# Patient Record
Sex: Female | Born: 1975 | Race: Black or African American | Hispanic: No | Marital: Married | Smoking: Never smoker
Health system: Southern US, Community
[De-identification: ages and names within clinical notes are randomized; demographics above are authoritative.]

## PROBLEM LIST (undated history)

## (undated) HISTORY — PX: TONSILLECTOMY: SUR1361

## (undated) HISTORY — PX: ABDOMINAL HYSTERECTOMY: SHX81

---

## 1999-10-25 ENCOUNTER — Encounter: Payer: Self-pay | Admitting: Family Medicine

## 1999-10-25 ENCOUNTER — Ambulatory Visit (HOSPITAL_COMMUNITY): Admission: RE | Admit: 1999-10-25 | Discharge: 1999-10-25 | Payer: Self-pay | Admitting: Family Medicine

## 1999-11-23 ENCOUNTER — Other Ambulatory Visit: Admission: RE | Admit: 1999-11-23 | Discharge: 1999-11-23 | Payer: Self-pay | Admitting: Family Medicine

## 2000-02-09 ENCOUNTER — Encounter: Payer: Self-pay | Admitting: Family Medicine

## 2000-02-09 ENCOUNTER — Ambulatory Visit (HOSPITAL_COMMUNITY): Admission: RE | Admit: 2000-02-09 | Discharge: 2000-02-09 | Payer: Self-pay | Admitting: Family Medicine

## 2000-03-05 ENCOUNTER — Other Ambulatory Visit: Admission: RE | Admit: 2000-03-05 | Discharge: 2000-03-05 | Payer: Self-pay | Admitting: Family Medicine

## 2000-03-05 ENCOUNTER — Other Ambulatory Visit: Admission: RE | Admit: 2000-03-05 | Discharge: 2000-03-05 | Payer: Self-pay | Admitting: Unknown Physician Specialty

## 2000-06-08 ENCOUNTER — Inpatient Hospital Stay (HOSPITAL_COMMUNITY): Admission: AD | Admit: 2000-06-08 | Discharge: 2000-06-11 | Payer: Self-pay | Admitting: Family Medicine

## 2002-05-30 ENCOUNTER — Other Ambulatory Visit: Admission: RE | Admit: 2002-05-30 | Discharge: 2002-05-30 | Payer: Self-pay | Admitting: Family Medicine

## 2004-03-31 ENCOUNTER — Other Ambulatory Visit: Admission: RE | Admit: 2004-03-31 | Discharge: 2004-03-31 | Payer: Self-pay | Admitting: Family Medicine

## 2004-04-26 ENCOUNTER — Ambulatory Visit: Payer: Self-pay | Admitting: Gastroenterology

## 2004-05-02 ENCOUNTER — Ambulatory Visit: Payer: Self-pay | Admitting: Gastroenterology

## 2012-08-18 ENCOUNTER — Encounter (HOSPITAL_BASED_OUTPATIENT_CLINIC_OR_DEPARTMENT_OTHER): Payer: Self-pay | Admitting: *Deleted

## 2012-08-18 ENCOUNTER — Emergency Department (HOSPITAL_BASED_OUTPATIENT_CLINIC_OR_DEPARTMENT_OTHER): Payer: BC Managed Care – HMO

## 2012-08-18 ENCOUNTER — Emergency Department (HOSPITAL_BASED_OUTPATIENT_CLINIC_OR_DEPARTMENT_OTHER)
Admission: EM | Admit: 2012-08-18 | Discharge: 2012-08-19 | Disposition: A | Discharge status: Home / Self Care | Payer: BC Managed Care – HMO | Attending: Emergency Medicine | Admitting: Emergency Medicine

## 2012-08-18 DIAGNOSIS — Z3202 Encounter for pregnancy test, result negative: Secondary | ICD-10-CM | POA: Insufficient documentation

## 2012-08-18 DIAGNOSIS — J45909 Unspecified asthma, uncomplicated: Secondary | ICD-10-CM

## 2012-08-18 DIAGNOSIS — R112 Nausea with vomiting, unspecified: Secondary | ICD-10-CM | POA: Insufficient documentation

## 2012-08-18 DIAGNOSIS — J45901 Unspecified asthma with (acute) exacerbation: Secondary | ICD-10-CM | POA: Insufficient documentation

## 2012-08-18 DIAGNOSIS — R0602 Shortness of breath: Secondary | ICD-10-CM | POA: Insufficient documentation

## 2012-08-18 LAB — URINALYSIS, ROUTINE W REFLEX MICROSCOPIC
Bilirubin Urine: NEGATIVE
Glucose, UA: NEGATIVE mg/dL
Hgb urine dipstick: NEGATIVE
Ketones, ur: NEGATIVE mg/dL
Leukocytes, UA: NEGATIVE
Nitrite: NEGATIVE
Protein, ur: NEGATIVE mg/dL
Specific Gravity, Urine: 1.022 (ref 1.005–1.030)
Urobilinogen, UA: 0.2 mg/dL (ref 0.0–1.0)
pH: 6 (ref 5.0–8.0)

## 2012-08-18 LAB — PREGNANCY, URINE: Preg Test, Ur: NEGATIVE

## 2012-08-18 NOTE — ED Notes (Signed)
Pt states she has had a cough off and on for years. Coughs up black flecks at times. Sometimes feels like something is sitting on her chest. Rash to neck. Food get s stuck. Hair loss. Coughs until she gags at triage. Feels tired. "Just don't feel good"

## 2012-08-18 NOTE — ED Notes (Signed)
Patient states BP cuff has caused her to get rash up her left arm.

## 2012-08-19 MED ORDER — HYDROCOD POLST-CHLORPHEN POLST 10-8 MG/5ML PO LQCR: 5.0000 mL | Freq: Two times a day (BID) | ORAL | Status: DC | PRN

## 2012-08-19 MED ORDER — FLUTICASONE PROPIONATE HFA 44 MCG/ACT IN AERO
2.0000 | INHALATION_SPRAY | Freq: Two times a day (BID) | RESPIRATORY_TRACT | Status: DC
  Administered 2012-08-19: 2 via RESPIRATORY_TRACT
  Filled 2012-08-19: qty 10.6

## 2012-08-19 NOTE — ED Provider Notes (Signed)
History    Scribed for Hanley Seamen, MD, the patient was seen in room MH03/MH03. This chart was scribed by Lewanda Rife, ED scribe. Patient's care was started at 0007   CSN: [id number]  Arrival date & time 08/18/12  2035   First MD Initiated Contact with Patient 08/19/12 0006      Chief Complaint  Patient presents with  . Cough    (Consider location/radiation/quality/duration/timing/severity/associated sxs/prior treatment) HPI HPI Comments: Lauren [name] is a 37 y.o. female who presents to the Emergency Department complaining of waxing and waning moderate cough onset November but worse for 2 days. Reports various associated symptoms including "occassional black flakes in vomit", patchy alopecia, shortness of breath, wheezing, nausea, posttussive emesis. Denies diarrhea, fever, abdominal pain, and chest pain. Reports cough is aggravated with deep breathing. Denies taking any medications at home for cough prior to arrival except albuterol. Reports prescribed prednisone courses given in the past have not relieved shortness of breath or cough. Reports using prescribed albuterol inhaler at home with no relief of symptoms.  Dr. Willaim Bane is PCP in [place]    History reviewed. No pertinent past medical history.  Past Surgical History  Procedure Laterality Date  . Cesarean section    . Tonsillectomy      History reviewed. No pertinent family history.  History  Substance Use Topics  . Smoking status: Never Smoker   . Smokeless tobacco: Not on file  . Alcohol Use: No    OB History   Grav Para Term Preterm Abortions TAB SAB Ect Mult Living                  Review of Systems  Respiratory: Positive for cough and shortness of breath.   Cardiovascular: Negative for chest pain.  All other systems reviewed and are negative.   A complete 10 system review of systems was obtained and all systems are negative except as noted in the HPI and PMH.    Allergies  Sulfa  antibiotics  Home Medications  No current outpatient prescriptions on file.  BP 148/82  Pulse 94  Temp(Src) 98.3 F (36.8 C) (Oral)  Resp 20  Ht 5' (1.524 m)  Wt 180 lb (81.647 kg)  BMI 35.15 kg/m2  SpO2 96%  LMP 08/18/2012  Physical Exam  Nursing note and vitals reviewed. Constitutional: She is oriented to person, place, and time. She appears well-developed and well-nourished. No distress.  HENT:  Head: Normocephalic and atraumatic.  Small patches of sparse hair   Eyes: EOM are normal.  Neck: Neck supple. No tracheal deviation present.  Cardiovascular: Normal rate.   Pulmonary/Chest: Effort normal. No respiratory distress. She exhibits no tenderness.  Decreased air movement bilaterally   Coughs on attempted deep breath   Abdominal: Soft. She exhibits no mass. There is no tenderness.  Musculoskeletal: Normal range of motion.  Neurological: She is alert and oriented to person, place, and time.  Skin: Skin is warm and dry.  Psychiatric: She has a normal mood and affect. Her behavior is normal.    ED Course  Procedures (including critical care time)    MDM   Nursing notes and vitals signs, including pulse oximetry, reviewed.  Summary of this visit's results, reviewed by myself:  Labs:  Results for orders placed during the hospital encounter of 08/18/12 (from the past 24 hour(s))  URINALYSIS, ROUTINE W REFLEX MICROSCOPIC     Status: None   Collection Time    08/18/12  9:34 PM  Result Value Range   Color, Urine YELLOW  YELLOW   APPearance CLEAR  CLEAR   Specific Gravity, Urine 1.022  1.005 - 1.030   pH 6.0  5.0 - 8.0   Glucose, UA NEGATIVE  NEGATIVE mg/dL   Hgb urine dipstick NEGATIVE  NEGATIVE   Bilirubin Urine NEGATIVE  NEGATIVE   Ketones, ur NEGATIVE  NEGATIVE mg/dL   Protein, ur NEGATIVE  NEGATIVE mg/dL   Urobilinogen, UA 0.2  0.0 - 1.0 mg/dL   Nitrite NEGATIVE  NEGATIVE   Leukocytes, UA NEGATIVE  NEGATIVE  PREGNANCY, URINE     Status: None    Collection Time    08/18/12  9:34 PM      Result Value Range   Preg Test, Ur NEGATIVE  NEGATIVE    Imaging Studies: Dg Chest 2 View  08/18/2012  *RADIOLOGY REPORT*  Clinical Data: Cough, shortness of breath.  CHEST - 2 VIEW  Comparison: None.  Findings: Heart is normal size.  Low lung volumes with bibasilar opacities on the frontal view, likely atelectasis.  No confluent opacity noted on the lateral view.  No effusions.  No acute bony abnormality.  IMPRESSION: Low lung volumes, bibasilar atelectasis.   Original Report Authenticated By: Charlett Nose, M.D.           I personally performed the services described in this documentation, which was scribed in my presence.  The recorded information has been reviewed and is accurate.   Hanley Seamen, MD 08/19/12 [phone]

## 2014-04-13 ENCOUNTER — Telehealth: Payer: Self-pay | Admitting: Family Medicine

## 2014-04-13 NOTE — Telephone Encounter (Signed)
Pt needs appt for pap and we are on her Medicaid insurance,pt given appt with [name] 05/13/14 @ 1.

## 2014-05-13 ENCOUNTER — Ambulatory Visit (INDEPENDENT_AMBULATORY_CARE_PROVIDER_SITE_OTHER): Payer: Medicaid Other | Admitting: Family

## 2014-05-13 ENCOUNTER — Encounter: Payer: Self-pay | Admitting: Family

## 2014-05-13 VITALS — BP 122/82 | HR 78 | Temp 98.4°F | Ht 60.0 in | Wt 206.0 lb

## 2014-05-13 DIAGNOSIS — N898 Other specified noninflammatory disorders of vagina: Secondary | ICD-10-CM

## 2014-05-13 DIAGNOSIS — Z01419 Encounter for gynecological examination (general) (routine) without abnormal findings: Secondary | ICD-10-CM

## 2014-05-13 DIAGNOSIS — Z0000 Encounter for general adult medical examination without abnormal findings: Secondary | ICD-10-CM

## 2014-05-13 DIAGNOSIS — Z713 Dietary counseling and surveillance: Secondary | ICD-10-CM

## 2014-05-13 DIAGNOSIS — N9489 Other specified conditions associated with female genital organs and menstrual cycle: Secondary | ICD-10-CM

## 2014-05-13 LAB — POCT WET PREP (WET MOUNT): TRICHOMONAS WET PREP HPF POC: NEGATIVE

## 2014-05-13 MED ORDER — TRIAMCINOLONE ACETONIDE 0.5 % EX OINT: 1.0000 "application " | TOPICAL_OINTMENT | Freq: Two times a day (BID) | CUTANEOUS | Status: DC

## 2014-05-13 MED ORDER — PHENTERMINE HCL 15 MG PO CAPS
15.0000 mg | ORAL_CAPSULE | ORAL | Status: DC
Start: 2014-05-13 — End: 2014-09-09

## 2014-05-13 NOTE — Progress Notes (Signed)
   Subjective:    Patient ID: Lauren [name], female    DOB: [birth year], 39 y.o.   MRN: [id number]  HPI Pt presents to office to establish care and CPE with pap. Pt currently not taking any medications at this time. Pt denies any headache, palpitations, SOB, or edema.   Pt states she is having some vaginal odor at this time and would like to be tested.    Review of Systems  Constitutional: Negative.   HENT: Negative.   Eyes: Negative.   Respiratory: Negative.  Negative for shortness of breath.   Cardiovascular: Negative.  Negative for palpitations.  Gastrointestinal: Negative.   Endocrine: Negative.   Genitourinary: Negative.   Musculoskeletal: Negative.   Neurological: Negative.  Negative for headaches.  Hematological: Negative.   Psychiatric/Behavioral: Negative.   All other systems reviewed and are negative.      Objective:   Physical Exam  Constitutional: She is oriented to person, place, and time. She appears well-developed and well-nourished. No distress.  HENT:  Head: Normocephalic and atraumatic.  Right Ear: External ear normal.  Left Ear: External ear normal.  Nose: Nose normal.  Mouth/Throat: Oropharynx is clear and moist.  Eyes: Pupils are equal, round, and reactive to light.  Neck: Normal range of motion. Neck supple. No thyromegaly present.  Cardiovascular: Normal rate, regular rhythm, normal heart sounds and intact distal pulses.   No murmur heard. Pulmonary/Chest: Effort normal and breath sounds normal. No respiratory distress. She has no wheezes.  Abdominal: Soft. Bowel sounds are normal. She exhibits no distension. There is no tenderness.  Genitourinary:  Bimanual exam- no adnexal masses or tenderness  Musculoskeletal: Normal range of motion. She exhibits no edema or tenderness.  Neurological: She is alert and oriented to person, place, and time. She has normal reflexes. No cranial nerve deficit.  Skin: Skin is warm and dry.  Psychiatric: She has a  normal mood and affect. Her behavior is normal. Judgment and thought content normal.  Vitals reviewed.   BP 122/82 mmHg  Pulse 78  Temp(Src) 98.4 F (36.9 C) (Oral)  Ht 5' (1.524 m)  Wt 206 lb (93.441 kg)  BMI 40.23 kg/m2  LMP 08/18/2012       Assessment & Plan:  1. Annual physical exam - CMP14+EGFR - Lipid panel - Thyroid Panel With TSH - Vit D  25 hydroxy (rtn osteoporosis monitoring) - Pap IG w/ reflex to HPV when ASC-U  2. Encounter for routine gynecological examination - Pap IG w/ reflex to HPV when ASC-U  3. Vaginal odor - POCT Wet Prep [facility])   4. Encounter for weight loss counseling -Diet and exericse -RTO in 8 weeks - DC if no weight loss in 12 weeks (<5%) - phentermine 15 MG capsule; Take 1 capsule (15 mg total) by mouth every morning.  Dispense: 30 capsule; Refill: 1   Continue all meds Labs pending Health Maintenance reviewed Diet and exercise encouraged RTO 1 year   [name], FNP

## 2014-05-13 NOTE — Patient Instructions (Addendum)

## 2014-05-14 LAB — VITAMIN D 25 HYDROXY (VIT D DEFICIENCY, FRACTURES): Vit D, 25-Hydroxy: 28.8 ng/mL — ABNORMAL LOW (ref 30.0–100.0)

## 2014-05-14 LAB — CMP14+EGFR
A/G RATIO: 1.2 (ref 1.1–2.5)
ALBUMIN: 4 g/dL (ref 3.5–5.5)
ALK PHOS: 99 IU/L (ref 39–117)
ALT: 23 IU/L (ref 0–32)
AST: 17 IU/L (ref 0–40)
BUN/Creatinine Ratio: 14 (ref 8–20)
BUN: 9 mg/dL (ref 6–20)
CALCIUM: 9.5 mg/dL (ref 8.7–10.2)
CHLORIDE: 106 mmol/L (ref 97–108)
CO2: 22 mmol/L (ref 18–29)
CREATININE: 0.64 mg/dL (ref 0.57–1.00)
GFR calc Af Amer: 130 mL/min/{1.73_m2} (ref >59)
GFR calc non Af Amer: 113 mL/min/{1.73_m2} (ref >59)
GLOBULIN, TOTAL: 3.3 g/dL (ref 1.5–4.5)
Glucose: 88 mg/dL (ref 65–99)
Potassium: 4.7 mmol/L (ref 3.5–5.2)
Sodium: 145 mmol/L — ABNORMAL HIGH (ref 134–144)
TOTAL PROTEIN: 7.3 g/dL (ref 6.0–8.5)
Total Bilirubin: <0.2 mg/dL (ref 0.0–1.2)

## 2014-05-14 LAB — LIPID PANEL
CHOL/HDL RATIO: 3.9 ratio (ref 0.0–4.4)
Cholesterol, Total: 176 mg/dL (ref 100–199)
HDL: 45 mg/dL (ref >39)
LDL CALC: 116 mg/dL — AB (ref 0–99)
TRIGLYCERIDES: 73 mg/dL (ref 0–149)
VLDL Cholesterol Cal: 15 mg/dL (ref 5–40)

## 2014-05-14 LAB — THYROID PANEL WITH TSH
Free Thyroxine Index: 2.2 (ref 1.2–4.9)
T3 Uptake Ratio: 26 % (ref 24–39)
T4, Total: 8.3 ug/dL (ref 4.5–12.0)
TSH: 1.54 u[IU]/mL (ref 0.450–4.500)

## 2014-05-15 ENCOUNTER — Telehealth: Payer: Self-pay | Admitting: Family

## 2014-05-15 ENCOUNTER — Other Ambulatory Visit: Payer: Self-pay | Admitting: Family

## 2014-05-15 DIAGNOSIS — E559 Vitamin D deficiency, unspecified: Secondary | ICD-10-CM | POA: Insufficient documentation

## 2014-05-15 DIAGNOSIS — E785 Hyperlipidemia, unspecified: Secondary | ICD-10-CM | POA: Insufficient documentation

## 2014-05-15 LAB — PAP IG W/ RFLX HPV ASCU: PAP SMEAR COMMENT: 0

## 2014-05-15 MED ORDER — VITAMIN D (ERGOCALCIFEROL) 1.25 MG (50000 UNIT) PO CAPS: 50000.0000 [IU] | ORAL_CAPSULE | ORAL | Status: DC

## 2014-05-15 MED ORDER — PRAVASTATIN SODIUM 20 MG PO TABS: 20.0000 mg | ORAL_TABLET | Freq: Every day | ORAL | Status: DC

## 2014-05-15 NOTE — Telephone Encounter (Signed)
-----   Message from Sharion Balloon, [place] sent at 05/15/2014  2:39 PM EST ----- Kidney and liver function stable Cholesterol levels elevated- RX sent to phar,ac Thyroid levels WNL Vit D levels low- RX sent to pharmacy Pap negative for lesion or malignancy

## 2014-05-15 NOTE — Telephone Encounter (Signed)
Lab result was sent to the pool

## 2014-05-15 NOTE — Telephone Encounter (Signed)
Need Labs Results and has Questions about a prescription for her at [facility].  Please call her.

## 2014-05-16 NOTE — Telephone Encounter (Signed)
Notified of results Verbalizes understanding

## 2014-07-10 ENCOUNTER — Ambulatory Visit: Payer: Medicaid Other | Admitting: Family

## 2014-09-09 ENCOUNTER — Encounter (INDEPENDENT_AMBULATORY_CARE_PROVIDER_SITE_OTHER): Payer: Self-pay

## 2014-09-09 ENCOUNTER — Ambulatory Visit (INDEPENDENT_AMBULATORY_CARE_PROVIDER_SITE_OTHER): Payer: Medicaid Other | Admitting: Family

## 2014-09-09 ENCOUNTER — Encounter: Payer: Self-pay | Admitting: Family

## 2014-09-09 VITALS — BP 132/96 | HR 83 | Temp 98.3°F | Ht 60.0 in | Wt 202.0 lb

## 2014-09-09 DIAGNOSIS — M5430 Sciatica, unspecified side: Secondary | ICD-10-CM | POA: Diagnosis not present

## 2014-09-09 MED ORDER — KETOROLAC TROMETHAMINE 60 MG/2ML IM SOLN
60.0000 mg | Freq: Once | INTRAMUSCULAR | Status: AC
  Administered 2014-09-09: 60 mg via INTRAMUSCULAR

## 2014-09-09 MED ORDER — MELOXICAM 15 MG PO TABS: 15.0000 mg | ORAL_TABLET | Freq: Every day | ORAL | Status: DC

## 2014-09-09 MED ORDER — PHENTERMINE HCL 37.5 MG PO CAPS: 37.5000 mg | ORAL_CAPSULE | ORAL | Status: DC

## 2014-09-09 MED ORDER — METHYLPREDNISOLONE ACETATE 80 MG/ML IJ SUSP
80.0000 mg | Freq: Once | INTRAMUSCULAR | Status: AC
  Administered 2014-09-09: 80 mg via INTRAMUSCULAR

## 2014-09-09 NOTE — Addendum Note (Signed)
Addended by: [name] A on: 09/09/2014 04:19 PM   Modules accepted: Level of Service

## 2014-09-09 NOTE — Progress Notes (Signed)
   Subjective:    Patient ID: Lauren [name], female    DOB: 1976/02/26, 39 y.o.   MRN: [id number]  Leg Pain  The incident occurred more than 1 week ago. There was no injury mechanism. The pain is present in the left leg, left foot and left thigh. Quality: "feels cold" The pain is at a severity of 5/10. The pain is mild. The pain has been intermittent since onset. Associated symptoms include numbness and tingling. Pertinent negatives include no loss of motion or muscle weakness. Nothing aggravates the symptoms. She has tried rest for the symptoms. The treatment provided mild relief.      Review of Systems  Constitutional: Negative.   HENT: Negative.   Eyes: Negative.   Respiratory: Negative.  Negative for shortness of breath.   Cardiovascular: Negative.  Negative for palpitations.  Gastrointestinal: Negative.   Endocrine: Negative.   Genitourinary: Negative.   Musculoskeletal: Negative.   Neurological: Positive for tingling and numbness. Negative for headaches.  Hematological: Negative.   Psychiatric/Behavioral: Negative.   All other systems reviewed and are negative.      Objective:   Physical Exam  Constitutional: She is oriented to person, place, and time. She appears well-developed and well-nourished. No distress.  HENT:  Head: Normocephalic and atraumatic.  Right Ear: External ear normal.  Left Ear: External ear normal.  Nose: Nose normal.  Mouth/Throat: Oropharynx is clear and moist.  Eyes: Pupils are equal, round, and reactive to light.  Neck: Normal range of motion. Neck supple. No thyromegaly present.  Cardiovascular: Normal rate, regular rhythm, normal heart sounds and intact distal pulses.   No murmur heard. Pulmonary/Chest: Effort normal and breath sounds normal. No respiratory distress. She has no wheezes.  Abdominal: Soft. Bowel sounds are normal. She exhibits no distension. There is no tenderness.  Musculoskeletal: Normal range of motion. She exhibits no edema  or tenderness.  Neurological: She is alert and oriented to person, place, and time. She has normal reflexes. No cranial nerve deficit.  Skin: Skin is warm and dry.  Psychiatric: She has a normal mood and affect. Her behavior is normal. Judgment and thought content normal.  Vitals reviewed.    BP 132/96 mmHg  Pulse 83  Temp(Src) 98.3 F (36.8 C) (Oral)  Ht 5' (1.524 m)  Wt 202 lb (91.627 kg)  BMI 39.45 kg/m2  LMP 08/18/2012      Assessment & Plan:  1. Sciatic leg pain -Rest -Ice -keep elevated as needed -No other NSAID's while taking Mobic -RTO prn - methylPREDNISolone acetate (DEPO-MEDROL) injection 80 mg; Inject 1 mL (80 mg total) into the muscle once. - ketorolac (TORADOL) injection 60 mg; Inject 2 mLs (60 mg total) into the muscle once. - meloxicam (MOBIC) 15 MG tablet; Take 1 tablet (15 mg total) by mouth daily.  Dispense: 30 tablet; Refill: 0  [name], FNP

## 2014-09-09 NOTE — Patient Instructions (Signed)
Sciatica Sciatica is pain, weakness, numbness, or tingling along the path of the sciatic nerve. The nerve starts in the lower back and runs down the back of each leg. The nerve controls the muscles in the lower leg and in the back of the knee, while also providing sensation to the back of the thigh, lower leg, and the sole of your foot. Sciatica is a symptom of another medical condition. For instance, nerve damage or certain conditions, such as a herniated disk or bone spur on the spine, pinch or put pressure on the sciatic nerve. This causes the pain, weakness, or other sensations normally associated with sciatica. Generally, sciatica only affects one side of the body. CAUSES   Herniated or slipped disc.  Degenerative disk disease.  A pain disorder involving the narrow muscle in the buttocks (piriformis syndrome).  Pelvic injury or fracture.  Pregnancy.  Tumor (rare). SYMPTOMS  Symptoms can vary from mild to very severe. The symptoms usually travel from the low back to the buttocks and down the back of the leg. Symptoms can include:  Mild tingling or dull aches in the lower back, leg, or hip.  Numbness in the back of the calf or sole of the foot.  Burning sensations in the lower back, leg, or hip.  Sharp pains in the lower back, leg, or hip.  Leg weakness.  Severe back pain inhibiting movement. These symptoms may get worse with coughing, sneezing, laughing, or prolonged sitting or standing. Also, being overweight may worsen symptoms. DIAGNOSIS  Your caregiver will perform a physical exam to look for common symptoms of sciatica. He or she may ask you to do certain movements or activities that would trigger sciatic nerve pain. Other tests may be performed to find the cause of the sciatica. These may include:  Blood tests.  X-rays.  Imaging tests, such as an MRI or CT scan. TREATMENT  Treatment is directed at the cause of the sciatic pain. Sometimes, treatment is not necessary  and the pain and discomfort goes away on its own. If treatment is needed, your caregiver may suggest:  Over-the-counter medicines to relieve pain.  Prescription medicines, such as anti-inflammatory medicine, muscle relaxants, or narcotics.  Applying heat or ice to the painful area.  Steroid injections to lessen pain, irritation, and inflammation around the nerve.  Reducing activity during periods of pain.  Exercising and stretching to strengthen your abdomen and improve flexibility of your spine. Your caregiver may suggest losing weight if the extra weight makes the back pain worse.  Physical therapy.  Surgery to eliminate what is pressing or pinching the nerve, such as a bone spur or part of a herniated disk. HOME CARE INSTRUCTIONS   Only take over-the-counter or prescription medicines for pain or discomfort as directed by your caregiver.  Apply ice to the affected area for 20 minutes, 3-4 times a day for the first 48-72 hours. Then try heat in the same way.  Exercise, stretch, or perform your usual activities if these do not aggravate your pain.  Attend physical therapy sessions as directed by your caregiver.  Keep all follow-up appointments as directed by your caregiver.  Do not wear high heels or shoes that do not provide proper support.  Check your mattress to see if it is too soft. A firm mattress may lessen your pain and discomfort. SEEK IMMEDIATE MEDICAL CARE IF:   You lose control of your bowel or bladder (incontinence).  You have increasing weakness in the lower back, pelvis, buttocks,  or legs.  You have redness or swelling of your back.  You have a burning sensation when you urinate.  You have pain that gets worse when you lie down or awakens you at night.  Your pain is worse than you have experienced in the past.  Your pain is lasting longer than 4 weeks.  You are suddenly losing weight without reason. MAKE SURE YOU:  Understand these  instructions.  Will watch your condition.  Will get help right away if you are not doing well or get worse. Document Released: 03/21/2001 Document Revised: 09/26/2011 Document Reviewed: 08/06/2011 [facility] Patient Information 2015 [place], [place]. This information is not intended to replace advice given to you by your health care provider. Make sure you discuss any questions you have with your health care provider.

## 2014-10-14 ENCOUNTER — Other Ambulatory Visit: Payer: Self-pay | Admitting: Family

## 2015-04-22 ENCOUNTER — Encounter: Payer: Self-pay | Admitting: Family

## 2015-04-22 ENCOUNTER — Ambulatory Visit (INDEPENDENT_AMBULATORY_CARE_PROVIDER_SITE_OTHER): Payer: Medicaid Other | Admitting: Family

## 2015-04-22 VITALS — BP 135/84 | HR 76 | Temp 97.3°F | Ht 60.0 in | Wt 205.0 lb

## 2015-04-22 DIAGNOSIS — A499 Bacterial infection, unspecified: Secondary | ICD-10-CM | POA: Diagnosis not present

## 2015-04-22 DIAGNOSIS — B9689 Other specified bacterial agents as the cause of diseases classified elsewhere: Secondary | ICD-10-CM

## 2015-04-22 DIAGNOSIS — N898 Other specified noninflammatory disorders of vagina: Secondary | ICD-10-CM

## 2015-04-22 DIAGNOSIS — N760 Acute vaginitis: Secondary | ICD-10-CM | POA: Diagnosis not present

## 2015-04-22 DIAGNOSIS — N9489 Other specified conditions associated with female genital organs and menstrual cycle: Secondary | ICD-10-CM

## 2015-04-22 LAB — POCT WET PREP (WET MOUNT)

## 2015-04-22 MED ORDER — METRONIDAZOLE 500 MG PO TABS: 500.0000 mg | ORAL_TABLET | Freq: Two times a day (BID) | ORAL | Status: DC

## 2015-04-22 NOTE — Progress Notes (Signed)
   Subjective:    Patient ID: Lauren [name], female    DOB: [birth year], 40 y.o.   MRN: [id number]  Vaginal Discharge The patient's primary symptoms include genital itching, a genital odor and vaginal discharge. The patient's pertinent negatives include no genital lesions or genital rash. This is a new problem. The current episode started more than 1 month ago. The problem occurs intermittently. The problem has been waxing and waning. The pain is mild. Associated symptoms include abdominal pain (right lower) and painful intercourse. Pertinent negatives include no back pain, chills, constipation, diarrhea, discolored urine, dysuria, frequency, hematuria, sore throat or urgency. The vaginal discharge was clear. There has been no bleeding. She has tried antibiotics for the symptoms. She is sexually active.      Review of Systems  Constitutional: Negative.  Negative for chills.  HENT: Negative.  Negative for sore throat.   Eyes: Negative.   Respiratory: Negative.  Negative for shortness of breath.   Cardiovascular: Negative.  Negative for palpitations.  Gastrointestinal: Positive for abdominal pain (right lower). Negative for diarrhea and constipation.  Endocrine: Negative.   Genitourinary: Positive for vaginal discharge. Negative for dysuria, urgency, frequency and hematuria.  Musculoskeletal: Negative.  Negative for back pain.  Neurological: Negative.   Hematological: Negative.   Psychiatric/Behavioral: Negative.   All other systems reviewed and are negative.      Objective:   Physical Exam  Constitutional: She is oriented to person, place, and time. She appears well-developed and well-nourished. No distress.  HENT:  Head: Normocephalic and atraumatic.  Right Ear: External ear normal.  Mouth/Throat: Oropharynx is clear and moist.  Eyes: Pupils are equal, round, and reactive to light.  Neck: Normal range of motion. Neck supple. No thyromegaly present.  Cardiovascular: Normal rate,  regular rhythm, normal heart sounds and intact distal pulses.   No murmur heard. Pulmonary/Chest: Effort normal and breath sounds normal. No respiratory distress. She has no wheezes.  Abdominal: Soft. Bowel sounds are normal. She exhibits no distension. There is no tenderness.  Musculoskeletal: Normal range of motion. She exhibits no edema or tenderness.  Neurological: She is alert and oriented to person, place, and time. She has normal reflexes. No cranial nerve deficit.  Skin: Skin is warm and dry.  Psychiatric: She has a normal mood and affect. Her behavior is normal. Judgment and thought content normal.  Vitals reviewed.     BP 177/98 mmHg  Pulse 99  Temp(Src) 97.3 F (36.3 C) (Oral)  Ht 5' (1.524 m)  Wt 205 lb (92.987 kg)  BMI 40.04 kg/m2  LMP 08/18/2012     Assessment & Plan:  1. Vaginal odor - POCT Wet Prep [facility])  2. BV (bacterial vaginosis) -Keep clean and dry -Avoid tight clothing -Cotton underwear -RTo prn - metroNIDAZOLE (FLAGYL) 500 MG tablet; Take 1 tablet (500 mg total) by mouth 2 (two) times daily.  Dispense: 14 tablet; Refill: 0  [name], FNP

## 2015-04-22 NOTE — Patient Instructions (Signed)
Bacterial Vaginosis Bacterial vaginosis is a vaginal infection that occurs when the normal balance of bacteria in the vagina is disrupted. It results from an overgrowth of certain bacteria. This is the most common vaginal infection in women of childbearing age. Treatment is important to prevent complications, especially in pregnant women, as it can cause a premature delivery. CAUSES  Bacterial vaginosis is caused by an increase in harmful bacteria that are normally present in smaller amounts in the vagina. Several different kinds of bacteria can cause bacterial vaginosis. However, the reason that the condition develops is not fully understood. RISK FACTORS Certain activities or behaviors can put you at an increased risk of developing bacterial vaginosis, including:  Having a new sex partner or multiple sex partners.  Douching.  Using an intrauterine device (IUD) for contraception. Women do not get bacterial vaginosis from toilet seats, bedding, swimming pools, or contact with objects around them. SIGNS AND SYMPTOMS  Some women with bacterial vaginosis have no signs or symptoms. Common symptoms include:  Grey vaginal discharge.  A fishlike odor with discharge, especially after sexual intercourse.  Itching or burning of the vagina and vulva.  Burning or pain with urination. DIAGNOSIS  Your health care provider will take a medical history and examine the vagina for signs of bacterial vaginosis. A sample of vaginal fluid may be taken. Your health care provider will look at this sample under a microscope to check for bacteria and abnormal cells. A vaginal pH test may also be done.  TREATMENT  Bacterial vaginosis may be treated with antibiotic medicines. These may be given in the form of a pill or a vaginal cream. A second round of antibiotics may be prescribed if the condition comes back after treatment. Because bacterial vaginosis increases your risk for sexually transmitted diseases, getting  treated can help reduce your risk for chlamydia, gonorrhea, HIV, and herpes. HOME CARE INSTRUCTIONS   Only take over-the-counter or prescription medicines as directed by your health care provider.  If antibiotic medicine was prescribed, take it as directed. Make sure you finish it even if you start to feel better.  Tell all sexual partners that you have a vaginal infection. They should see their health care provider and be treated if they have problems, such as a mild rash or itching.  During treatment, it is important that you follow these instructions:  Avoid sexual activity or use condoms correctly.  Do not douche.  Avoid alcohol as directed by your health care provider.  Avoid breastfeeding as directed by your health care provider. SEEK MEDICAL CARE IF:   Your symptoms are not improving after 3 days of treatment.  You have increased discharge or pain.  You have a fever. MAKE SURE YOU:   Understand these instructions.  Will watch your condition.  Will get help right away if you are not doing well or get worse. FOR MORE INFORMATION  Centers for Disease Control and Prevention, Division of STD Prevention: [employer]raiser[employer]raud[employer] American Sexual Health Association (ASHA): www.ashastd.org    This information is not intended to replace advice given to you by your health care provider. Make sure you discuss any questions you have with your health care provider.   Document Released: 03/27/2005 Document Revised: 04/17/2014 Document Reviewed: 11/06/2012 Elsevier Interactive Patient Education [employer].

## 2015-12-01 ENCOUNTER — Encounter: Payer: Self-pay | Admitting: Pediatrics

## 2015-12-01 ENCOUNTER — Ambulatory Visit (INDEPENDENT_AMBULATORY_CARE_PROVIDER_SITE_OTHER): Payer: Self-pay | Admitting: Pediatrics

## 2015-12-01 VITALS — BP 122/83 | HR 88 | Temp 97.3°F | Ht 60.0 in | Wt 204.0 lb

## 2015-12-01 DIAGNOSIS — Z021 Encounter for pre-employment examination: Secondary | ICD-10-CM

## 2015-12-01 DIAGNOSIS — Z111 Encounter for screening for respiratory tuberculosis: Secondary | ICD-10-CM

## 2015-12-01 DIAGNOSIS — E669 Obesity, unspecified: Secondary | ICD-10-CM

## 2015-12-01 DIAGNOSIS — Z23 Encounter for immunization: Secondary | ICD-10-CM

## 2015-12-01 NOTE — Addendum Note (Signed)
Addended by: Rolena Infante on: 12/01/2015 03:41 PM   Modules accepted: Orders

## 2015-12-01 NOTE — Progress Notes (Signed)
  Subjective:   Patient ID: Lauren [name], female    DOB: May 05, 1975, 40 y.o.   MRN: [id number] CC: Physical for work  HPI: Lauren [name] is a 40 y.o. 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 Allergies and medications reviewed and updated. History  Smoking Status  . Never Smoker  Smokeless Tobacco  . Not on file   ROS: Per HPI   Objective:    BP 122/83   Pulse 88   Temp 97.3 F (36.3 C) (Oral)   Ht 5' (1.524 m)   Wt 204 lb (92.5 kg)   LMP 08/18/2012   BMI 39.84 kg/m   Wt Readings from Last 3 Encounters:  12/01/15 204 lb (92.5 kg)  04/22/15 205 lb (93 kg)  09/09/14 202 lb (91.6 kg)    Gen: NAD, alert, cooperative with exam, NCAT EYES: EOMI, no conjunctival injection, or no icterus CV: NRRR, normal S1/S2, no murmur, distal pulses 2+ b/l Resp: CTABL, no wheezes, normal WOB Abd: +BS, soft, NTND. no guarding or organomegaly Ext: No edema, warm Neuro: Alert and oriented, strength equal b/l UE and LE, coordination grossly normal MSK: normal muscle bulk  Assessment & Plan:  [name] was seen today for physical for work.  Diagnoses and all orders for this visit:  Encounter for pre-employment examination Needs ppd and Tdap  Obesity, Class II, BMI 35-39.9 Decrease juice to none before next visit  Swelling Trial compression hose Always better in the morning  Pap smear: last 05/2014  Follow up plan: 2 mo for CPE Assunta Found, MD [place]

## 2015-12-03 ENCOUNTER — Ambulatory Visit: Payer: Self-pay | Admitting: *Deleted

## 2015-12-03 DIAGNOSIS — Z111 Encounter for screening for respiratory tuberculosis: Secondary | ICD-10-CM

## 2015-12-03 LAB — TB SKIN TEST
Induration: 0 mm
TB SKIN TEST: NEGATIVE

## 2015-12-03 NOTE — Progress Notes (Signed)
Pt here to have PPD read and PPD is negative.

## 2017-09-25 ENCOUNTER — Ambulatory Visit: Payer: BC Managed Care – PPO | Admitting: Family

## 2017-09-25 ENCOUNTER — Encounter: Payer: Self-pay | Admitting: Family

## 2017-09-25 ENCOUNTER — Ambulatory Visit (INDEPENDENT_AMBULATORY_CARE_PROVIDER_SITE_OTHER): Payer: BC Managed Care – PPO

## 2017-09-25 VITALS — BP 139/88 | HR 85 | Temp 98.2°F | Ht 60.0 in | Wt 202.8 lb

## 2017-09-25 DIAGNOSIS — R0602 Shortness of breath: Secondary | ICD-10-CM

## 2017-09-25 DIAGNOSIS — J4521 Mild intermittent asthma with (acute) exacerbation: Secondary | ICD-10-CM

## 2017-09-25 DIAGNOSIS — J45909 Unspecified asthma, uncomplicated: Secondary | ICD-10-CM | POA: Insufficient documentation

## 2017-09-25 DIAGNOSIS — J069 Acute upper respiratory infection, unspecified: Secondary | ICD-10-CM | POA: Diagnosis not present

## 2017-09-25 MED ORDER — MONTELUKAST SODIUM 10 MG PO TABS: 10.0000 mg | ORAL_TABLET | Freq: Every day | ORAL | 1 refills | Status: DC

## 2017-09-25 MED ORDER — PREDNISONE 10 MG (21) PO TBPK: ORAL_TABLET | ORAL | 0 refills | Status: DC

## 2017-09-25 MED ORDER — BUDESONIDE-FORMOTEROL FUMARATE 80-4.5 MCG/ACT IN AERO: 2.0000 | INHALATION_SPRAY | Freq: Two times a day (BID) | RESPIRATORY_TRACT | 3 refills | Status: DC

## 2017-09-25 NOTE — Patient Instructions (Signed)

## 2017-09-25 NOTE — Progress Notes (Signed)
   Subjective:    Patient ID: Lauren [name], female    DOB: 01-Sep-1975, 42 y.o.   MRN: [id number]  Chief Complaint  Patient presents with  . recheck from Urgent Care visit    Asthma  She complains of cough, difficulty breathing, frequent throat clearing, hoarse voice, shortness of breath, sputum production and wheezing. The problem occurs intermittently. The cough is productive of sputum. Associated symptoms include malaise/fatigue and orthopnea. Pertinent negatives include no dyspnea on exertion or sore throat. Her symptoms are alleviated by OTC inhaler and oral steroids (zpak). Her symptoms are not alleviated by rest, OTC cough suppressant and oral steroids. Her past medical history is significant for asthma.      Review of Systems  Constitutional: Positive for malaise/fatigue.  HENT: Positive for hoarse voice. Negative for sore throat.   Respiratory: Positive for cough, sputum production, shortness of breath and wheezing.   Cardiovascular: Negative for dyspnea on exertion.  All other systems reviewed and are negative.      Objective:   Physical Exam  Constitutional: She is oriented to person, place, and time. She appears well-developed and well-nourished. No distress.  HENT:  Head: Normocephalic and atraumatic.  Right Ear: External ear normal.  Left Ear: External ear normal.  Mouth/Throat: Oropharynx is clear and moist.  Eyes: Pupils are equal, round, and reactive to light.  Neck: Normal range of motion. Neck supple. No thyromegaly present.  Cardiovascular: Normal rate, regular rhythm, normal heart sounds and intact distal pulses.  No murmur heard. Pulmonary/Chest: Effort normal and breath sounds normal. No respiratory distress. She has no wheezes.  Constant nonproductive cough  Abdominal: Soft. Bowel sounds are normal. She exhibits no distension. There is no tenderness.  Musculoskeletal: Normal range of motion. She exhibits no edema or tenderness.  Neurological: She is  alert and oriented to person, place, and time. She has normal reflexes. No cranial nerve deficit.  Skin: Skin is warm and dry.  Psychiatric: She has a normal mood and affect. Her behavior is normal. Judgment and thought content normal.  Vitals reviewed.     BP 139/88   Pulse 85   Temp 98.2 F (36.8 C) (Oral)   Ht 5' (1.524 m)   Wt 202 lb 12.8 oz (92 kg)   LMP 08/18/2012   BMI 39.61 kg/m      Assessment & Plan:  Lauren [name] comes in today with chief complaint of recheck from Urgent Care visit   Diagnosis and orders addressed:  1. Mild intermittent asthma with acute exacerbation Avoid allergens Start Singulair and prednisone Sample of Symbicort given - montelukast (SINGULAIR) 10 MG tablet; Take 1 tablet (10 mg total) by mouth at bedtime.  Dispense: 90 tablet; Refill: 1 - predniSONE (STERAPRED UNI-PAK 21 TAB) 10 MG (21) TBPK tablet; Use as directed  Dispense: 21 tablet; Refill: 0 - DG Chest 2 View; Future - budesonide-formoterol (SYMBICORT) 80-4.5 MCG/ACT inhaler; Inhale 2 puffs into the lungs 2 (two) times daily.  Dispense: 1 Inhaler; Refill: 3  2. Viral upper respiratory tract infection - predniSONE (STERAPRED UNI-PAK 21 TAB) 10 MG (21) TBPK tablet; Use as directed  Dispense: 21 tablet; Refill: 0  3. SOB (shortness of breath) - DG Chest 2 View; Future   Lauren Dun, FNP

## 2017-10-01 ENCOUNTER — Encounter: Payer: Self-pay | Admitting: Family

## 2017-10-01 ENCOUNTER — Ambulatory Visit (INDEPENDENT_AMBULATORY_CARE_PROVIDER_SITE_OTHER): Payer: BC Managed Care – PPO | Admitting: Family

## 2017-10-01 VITALS — BP 128/86 | HR 84 | Temp 97.5°F | Ht 60.0 in | Wt 206.8 lb

## 2017-10-01 DIAGNOSIS — Z01419 Encounter for gynecological examination (general) (routine) without abnormal findings: Secondary | ICD-10-CM | POA: Diagnosis not present

## 2017-10-01 DIAGNOSIS — Z0000 Encounter for general adult medical examination without abnormal findings: Secondary | ICD-10-CM

## 2017-10-01 DIAGNOSIS — E559 Vitamin D deficiency, unspecified: Secondary | ICD-10-CM

## 2017-10-01 DIAGNOSIS — N898 Other specified noninflammatory disorders of vagina: Secondary | ICD-10-CM

## 2017-10-01 DIAGNOSIS — E785 Hyperlipidemia, unspecified: Secondary | ICD-10-CM

## 2017-10-01 DIAGNOSIS — J4521 Mild intermittent asthma with (acute) exacerbation: Secondary | ICD-10-CM

## 2017-10-01 LAB — WET PREP FOR TRICH, YEAST, CLUE
Clue Cell Exam: NEGATIVE
Trichomonas Exam: NEGATIVE
YEAST EXAM: NEGATIVE

## 2017-10-01 MED ORDER — METRONIDAZOLE 500 MG PO TABS: 500.0000 mg | ORAL_TABLET | Freq: Two times a day (BID) | ORAL | 0 refills | Status: DC

## 2017-10-01 NOTE — Progress Notes (Signed)
Subjective:    Patient ID: Lauren [name], female    DOB: 13-Dec-1975, 42 y.o.   MRN: [id number]  No chief complaint on file.  PT presents to the office today for CPE with pap.  Asthma  She complains of cough and wheezing. This is a chronic problem. The current episode started more than 1 year ago. The problem occurs intermittently. The problem has been waxing and waning. Pertinent negatives include no ear pain, myalgias or sore throat. Her symptoms are not alleviated by rest and steroid inhaler. Her past medical history is significant for asthma.  Hyperlipidemia  This is a chronic problem. The current episode started more than 1 year ago. The problem is uncontrolled. Recent lipid tests were reviewed and are high. Exacerbating diseases include obesity. Pertinent negatives include no myalgias. Current antihyperlipidemic treatment includes diet change. The current treatment provides mild improvement of lipids. Risk factors for coronary artery disease include dyslipidemia, obesity, hypertension and a sedentary lifestyle.  Gynecologic Exam  The patient's primary symptoms include a genital odor and vaginal discharge. The current episode started 1 to 4 weeks ago. The problem occurs intermittently. Pertinent negatives include no sore throat.      Review of Systems  HENT: Negative for ear pain and sore throat.   Respiratory: Positive for cough and wheezing.   Genitourinary: Positive for vaginal discharge.  Musculoskeletal: Negative for myalgias.  All other systems reviewed and are negative.      Objective:   Physical Exam  Constitutional: She is oriented to person, place, and time. She appears well-developed and well-nourished. No distress.  HENT:  Head: Normocephalic and atraumatic.  Right Ear: External ear normal.  Left Ear: External ear normal.  Mouth/Throat: Oropharynx is clear and moist.  Eyes: Pupils are equal, round, and reactive to light.  Neck: Normal range of motion. Neck  supple. No thyromegaly present.  Cardiovascular: Normal rate, regular rhythm, normal heart sounds and intact distal pulses.  No murmur heard. Pulmonary/Chest: Effort normal and breath sounds normal. No respiratory distress. She has no wheezes. Right breast exhibits no inverted nipple, no mass, no nipple discharge, no skin change and no tenderness. Left breast exhibits no inverted nipple, no mass, no nipple discharge, no skin change and no tenderness.  Abdominal: Soft. Bowel sounds are normal. She exhibits no distension. There is no tenderness.  Genitourinary: Vagina normal.  Genitourinary Comments: Bimanual exam- no adnexal masses or tenderness, ovaries nonpalpable    No discharge   Musculoskeletal: Normal range of motion. She exhibits no edema or tenderness.  Neurological: She is alert and oriented to person, place, and time. She has normal reflexes. No cranial nerve deficit.  Skin: Skin is warm and dry.  Psychiatric: She has a normal mood and affect. Her behavior is normal. Judgment and thought content normal.  Vitals reviewed.     BP 128/86   Pulse 84   Temp (!) 97.5 F (36.4 C)   Ht 5' (1.524 m)   Wt 206 lb 12.8 oz (93.8 kg)   LMP 08/18/2012   BMI 40.39 kg/m      Assessment & Plan:  Lauren [name] comes in today with chief complaint of No chief complaint on file.   Diagnosis and orders addressed:  1. Mild intermittent asthma with acute exacerbation - CMP14+EGFR - CBC with Differential/Platelet  2. Vitamin D deficiency - CMP14+EGFR - CBC with Differential/Platelet - VITAMIN D 25 Hydroxy (Vit-D Deficiency, Fractures)  3. Hyperlipidemia, unspecified hyperlipidemia type - CMP14+EGFR - CBC with Differential/Platelet -  Lipid panel  4. Annual physical exam - CMP14+EGFR - CBC with Differential/Platelet - Lipid panel - TSH - VITAMIN D 25 Hydroxy (Vit-D Deficiency, Fractures) - IGP, Aptima HPV, rfx 16/18,45  5. Gynecologic exam normal - CMP14+EGFR - CBC with  Differential/Platelet - IGP, Aptima HPV, rfx 16/18,45  6. Vaginal discharge - WET PREP FOR [place], YEAST, [place] pending Health Maintenance reviewed Diet and exercise encouraged  Follow up plan: 1 year    [name], FNP

## 2017-10-01 NOTE — Patient Instructions (Addendum)
Health Maintenance, Female Adopting a healthy lifestyle and getting preventive care can go a long way to promote health and wellness. Talk with your health care provider about what schedule of regular examinations is right for you. This is a good chance for you to check in with your provider about disease prevention and staying healthy. In between checkups, there are plenty of things you can do on your own. Experts have done a lot of research about which lifestyle changes and preventive measures are most likely to keep you healthy. Ask your health care provider for more information. Weight and diet Eat a healthy diet  Be sure to include plenty of vegetables, fruits, low-fat dairy products, and lean protein.  Do not eat a lot of foods high in solid fats, added sugars, or salt.  Get regular exercise. This is one of the most important things you can do for your health. ? Most adults should exercise for at least 150 minutes each week. The exercise should increase your heart rate and make you sweat (moderate-intensity exercise). ? Most adults should also do strengthening exercises at least twice a week. This is in addition to the moderate-intensity exercise.  Maintain a healthy weight  Body mass index (BMI) is a measurement that can be used to identify possible weight problems. It estimates body fat based on height and weight. Your health care provider can help determine your BMI and help you achieve or maintain a healthy weight.  For females 20 years of age and older: ? A BMI below 18.5 is considered underweight. ? A BMI of 18.5 to 24.9 is normal. ? A BMI of 25 to 29.9 is considered overweight. ? A BMI of 30 and above is considered obese.  Watch levels of cholesterol and blood lipids  You should start having your blood tested for lipids and cholesterol at 43 years of age, then have this test every 5 years.  You may need to have your cholesterol levels checked more often if: ? Your lipid or  cholesterol levels are high. ? You are older than 43 years of age. ? You are at high risk for heart disease.  Cancer screening Lung Cancer  Lung cancer screening is recommended for adults 55-80 years old who are at high risk for lung cancer because of a history of smoking.  A yearly low-dose CT scan of the lungs is recommended for people who: ? Currently smoke. ? Have quit within the past 15 years. ? Have at least a 30-pack-year history of smoking. A pack year is smoking an average of one pack of cigarettes a day for 1 year.  Yearly screening should continue until it has been 15 years since you quit.  Yearly screening should stop if you develop a health problem that would prevent you from having lung cancer treatment.  Breast Cancer  Practice breast self-awareness. This means understanding how your breasts normally appear and feel.  It also means doing regular breast self-exams. Let your health care provider know about any changes, no matter how small.  If you are in your 20s or 30s, you should have a clinical breast exam (CBE) by a health care provider every 1-3 years as part of a regular health exam.  If you are 40 or older, have a CBE every year. Also consider having a breast X-ray (mammogram) every year.  If you have a family history of breast cancer, talk to your health care provider about genetic screening.  If you are at high risk   for breast cancer, talk to your health care provider about having an MRI and a mammogram every year.  Breast cancer gene (BRCA) assessment is recommended for women who have family members with BRCA-related cancers. BRCA-related cancers include: ? Breast. ? Ovarian. ? Tubal. ? Peritoneal cancers.  Results of the assessment will determine the need for genetic counseling and BRCA1 and BRCA2 testing.  Cervical Cancer Your health care provider may recommend that you be screened regularly for cancer of the pelvic organs (ovaries, uterus, and  vagina). This screening involves a pelvic examination, including checking for microscopic changes to the surface of your cervix (Pap test). You may be encouraged to have this screening done every 3 years, beginning at age 22.  For women ages 56-65, health care providers may recommend pelvic exams and Pap testing every 3 years, or they may recommend the Pap and pelvic exam, combined with testing for human papilloma virus (HPV), every 5 years. Some types of HPV increase your risk of cervical cancer. Testing for HPV may also be done on women of any age with unclear Pap test results.  Other health care providers may not recommend any screening for nonpregnant women who are considered low risk for pelvic cancer and who do not have symptoms. Ask your health care provider if a screening pelvic exam is right for you.  If you have had past treatment for cervical cancer or a condition that could lead to cancer, you need Pap tests and screening for cancer for at least 20 years after your treatment. If Pap tests have been discontinued, your risk factors (such as having a new sexual partner) need to be reassessed to determine if screening should resume. Some women have medical problems that increase the chance of getting cervical cancer. In these cases, your health care provider may recommend more frequent screening and Pap tests.  Colorectal Cancer  This type of cancer can be detected and often prevented.  Routine colorectal cancer screening usually begins at 43 years of age and continues through 43 years of age.  Your health care provider may recommend screening at an earlier age if you have risk factors for colon cancer.  Your health care provider may also recommend using home test kits to check for hidden blood in the stool.  A small camera at the end of a tube can be used to examine your colon directly (sigmoidoscopy or colonoscopy). This is done to check for the earliest forms of colorectal  cancer.  Routine screening usually begins at age 33.  Direct examination of the colon should be repeated every 5-10 years through 43 years of age. However, you may need to be screened more often if early forms of precancerous polyps or small growths are found.  Skin Cancer  Check your skin from head to toe regularly.  Tell your health care provider about any new moles or changes in moles, especially if there is a change in a mole's shape or color.  Also tell your health care provider if you have a mole that is larger than the size of a pencil eraser.  Always use sunscreen. Apply sunscreen liberally and repeatedly throughout the day.  Protect yourself by wearing long sleeves, pants, a wide-brimmed hat, and sunglasses whenever you are outside.  Heart disease, diabetes, and high blood pressure  High blood pressure causes heart disease and increases the risk of stroke. High blood pressure is more likely to develop in: ? People who have blood pressure in the high end of  the normal range (130-139/85-89 mm Hg). ? People who are overweight or obese. ? People who are African American.  If you are 21-29 years of age, have your blood pressure checked every 3-5 years. If you are 3 years of age or older, have your blood pressure checked every year. You should have your blood pressure measured twice-once when you are at a hospital or clinic, and once when you are not at a hospital or clinic. Record the average of the two measurements. To check your blood pressure when you are not at a hospital or clinic, you can use: ? An automated blood pressure machine at a pharmacy. ? A home blood pressure monitor.  If you are between 17 years and 37 years old, ask your health care provider if you should take aspirin to prevent strokes.  Have regular diabetes screenings. This involves taking a blood sample to check your fasting blood sugar level. ? If you are at a normal weight and have a low risk for diabetes,  have this test once every three years after 43 years of age. ? If you are overweight and have a high risk for diabetes, consider being tested at a younger age or more often. Preventing infection Hepatitis B  If you have a higher risk for hepatitis B, you should be screened for this virus. You are considered at high risk for hepatitis B if: ? You were born in a country where hepatitis B is common. Ask your health care provider which countries are considered high risk. ? Your parents were born in a high-risk country, and you have not been immunized against hepatitis B (hepatitis B vaccine). ? You have HIV or AIDS. ? You use needles to inject street drugs. ? You live with someone who has hepatitis B. ? You have had sex with someone who has hepatitis B. ? You get hemodialysis treatment. ? You take certain medicines for conditions, including cancer, organ transplantation, and autoimmune conditions.  Hepatitis C  Blood testing is recommended for: ? Everyone born from [birth year] through 1966. ? Anyone with known risk factors for hepatitis C.  Sexually transmitted infections (STIs)  You should be screened for sexually transmitted infections (STIs) including gonorrhea and chlamydia if: ? You are sexually active and are younger than 43 years of age. ? You are older than 43 years of age and your health care provider tells you that you are at risk for this type of infection. ? Your sexual activity has changed since you were last screened and you are at an increased risk for chlamydia or gonorrhea. Ask your health care provider if you are at risk.  If you do not have HIV, but are at risk, it may be recommended that you take a prescription medicine daily to prevent HIV infection. This is called pre-exposure prophylaxis (PrEP). You are considered at risk if: ? You are sexually active and do not regularly use condoms or know the HIV status of your partner(s). ? You take drugs by injection. ? You are  sexually active with a partner who has HIV.  Talk with your health care provider about whether you are at high risk of being infected with HIV. If you choose to begin PrEP, you should first be tested for HIV. You should then be tested every 3 months for as long as you are taking PrEP. Pregnancy  If you are premenopausal and you may become pregnant, ask your health care provider about preconception counseling.  If you may become  pregnant, take 400 to 800 micrograms (mcg) of folic acid every day.  If you want to prevent pregnancy, talk to your health care provider about birth control (contraception). Osteoporosis and menopause  Osteoporosis is a disease in which the bones lose minerals and strength with aging. This can result in serious bone fractures. Your risk for osteoporosis can be identified using a bone density scan.  If you are 65 years of age or older, or if you are at risk for osteoporosis and fractures, ask your health care provider if you should be screened.  Ask your health care provider whether you should take a calcium or vitamin D supplement to lower your risk for osteoporosis.  Menopause may have certain physical symptoms and risks.  Hormone replacement therapy may reduce some of these symptoms and risks. Talk to your health care provider about whether hormone replacement therapy is right for you. Follow these instructions at home:  Schedule regular health, dental, and eye exams.  Stay current with your immunizations.  Do not use any tobacco products including cigarettes, chewing tobacco, or electronic cigarettes.  If you are pregnant, do not drink alcohol.  If you are breastfeeding, limit how much and how often you drink alcohol.  Limit alcohol intake to no more than 1 drink per day for nonpregnant women. One drink equals 12 ounces of beer, 5 ounces of wine, or 1 ounces of hard liquor.  Do not use street drugs.  Do not share needles.  Ask your health care  provider for help if you need support or information about quitting drugs.  Tell your health care provider if you often feel depressed.  Tell your health care provider if you have ever been abused or do not feel safe at home. This information is not intended to replace advice given to you by your health care provider. Make sure you discuss any questions you have with your health care provider. Document Released: 10/10/2010 Document Revised: 09/02/2015 Document Reviewed: 12/29/2014 Elsevier Interactive Patient Education  2018 Elsevier Inc.  Bacterial Vaginosis Bacterial vaginosis is a vaginal infection that occurs when the normal balance of bacteria in the vagina is disrupted. It results from an overgrowth of certain bacteria. This is the most common vaginal infection among women ages 15-44. Because bacterial vaginosis increases your risk for STIs (sexually transmitted infections), getting treated can help reduce your risk for chlamydia, gonorrhea, herpes, and HIV (human immunodeficiency virus). Treatment is also important for preventing complications in pregnant women, because this condition can cause an early (premature) delivery. What are the causes? This condition is caused by an increase in harmful bacteria that are normally present in small amounts in the vagina. However, the reason that the condition develops is not fully understood. What increases the risk? The following factors may make you more likely to develop this condition:  Having a new sexual partner or multiple sexual partners.  Having unprotected sex.  Douching.  Having an intrauterine device (IUD).  Smoking.  Drug and alcohol abuse.  Taking certain antibiotic medicines.  Being pregnant.  You cannot get bacterial vaginosis from toilet seats, bedding, swimming pools, or contact with objects around you. What are the signs or symptoms? Symptoms of this condition include:  Grey or white vaginal discharge. The  discharge can also be watery or foamy.  A fish-like odor with discharge, especially after sexual intercourse or during menstruation.  Itching in and around the vagina.  Burning or pain with urination.  Some women with bacterial vaginosis have no   signs or symptoms. How is this diagnosed? This condition is diagnosed based on:  Your medical history.  A physical exam of the vagina.  Testing a sample of vaginal fluid under a microscope to look for a large amount of bad bacteria or abnormal cells. Your health care provider may use a cotton swab or a small wooden spatula to collect the sample.  How is this treated? This condition is treated with antibiotics. These may be given as a pill, a vaginal cream, or a medicine that is put into the vagina (suppository). If the condition comes back after treatment, a second round of antibiotics may be needed. Follow these instructions at home: Medicines  Take over-the-counter and prescription medicines only as told by your health care provider.  Take or use your antibiotic as told by your health care provider. Do not stop taking or using the antibiotic even if you start to feel better. General instructions  If you have a female sexual partner, tell her that you have a vaginal infection. She should see her health care provider and be treated if she has symptoms. If you have a female sexual partner, he does not need treatment.  During treatment: ? Avoid sexual activity until you finish treatment. ? Do not douche. ? Avoid alcohol as directed by your health care provider. ? Avoid breastfeeding as directed by your health care provider.  Drink enough water and fluids to keep your urine clear or pale yellow.  Keep the area around your vagina and rectum clean. ? Wash the area daily with warm water. ? Wipe yourself from front to back after using the toilet.  Keep all follow-up visits as told by your health care provider. This is important. How is this  prevented?  Do not douche.  Wash the outside of your vagina with warm water only.  Use protection when having sex. This includes latex condoms and dental dams.  Limit how many sexual partners you have. To help prevent bacterial vaginosis, it is best to have sex with just one partner (monogamous).  Make sure you and your sexual partner are tested for STIs.  Wear cotton or cotton-lined underwear.  Avoid wearing tight pants and pantyhose, especially during summer.  Limit the amount of alcohol that you drink.  Do not use any products that contain nicotine or tobacco, such as cigarettes and e-cigarettes. If you need help quitting, ask your health care provider.  Do not use illegal drugs. Where to find more information:  Centers for Disease Control and Prevention: AppraiserFraud.fi  American Sexual Health Association (ASHA): www.ashastd.org  [place] Department of Health and Financial controller, Office on Women's Health: DustingSprays.pl or SecuritiesCard.it Contact a health care provider if:  Your symptoms do not improve, even after treatment.  You have more discharge or pain when urinating.  You have a fever.  You have pain in your abdomen.  You have pain during sex.  You have vaginal bleeding between periods. Summary  Bacterial vaginosis is a vaginal infection that occurs when the normal balance of bacteria in the vagina is disrupted.  Because bacterial vaginosis increases your risk for STIs (sexually transmitted infections), getting treated can help reduce your risk for chlamydia, gonorrhea, herpes, and HIV (human immunodeficiency virus). Treatment is also important for preventing complications in pregnant women, because the condition can cause an early (premature) delivery.  This condition is treated with antibiotic medicines. These may be given as a pill, a vaginal cream, or a medicine that is put into the vagina (  suppository). This  information is not intended to replace advice given to you by your health care provider. Make sure you discuss any questions you have with your health care provider. Document Released: 03/27/2005 Document Revised: 07/31/2016 Document Reviewed: 12/11/2015 Elsevier Interactive Patient Education  2018 Elsevier Inc.  

## 2017-10-02 ENCOUNTER — Telehealth: Payer: Self-pay | Admitting: Family

## 2017-10-02 LAB — CMP14+EGFR
ALBUMIN: 3.7 g/dL (ref 3.5–5.5)
ALT: 20 IU/L (ref 0–32)
AST: 13 IU/L (ref 0–40)
Albumin/Globulin Ratio: 1.3 (ref 1.2–2.2)
Alkaline Phosphatase: 82 IU/L (ref 39–117)
BUN/Creatinine Ratio: 19 (ref 9–23)
BUN: 11 mg/dL (ref 6–24)
Bilirubin Total: <0.2 mg/dL (ref 0.0–1.2)
CALCIUM: 8.9 mg/dL (ref 8.7–10.2)
CO2: 23 mmol/L (ref 20–29)
CREATININE: 0.58 mg/dL (ref 0.57–1.00)
Chloride: 104 mmol/L (ref 96–106)
GFR calc Af Amer: 131 mL/min/{1.73_m2} (ref >59)
GFR, EST NON AFRICAN AMERICAN: 114 mL/min/{1.73_m2} (ref >59)
Globulin, Total: 2.9 g/dL (ref 1.5–4.5)
Glucose: 102 mg/dL — ABNORMAL HIGH (ref 65–99)
Potassium: 4 mmol/L (ref 3.5–5.2)
Sodium: 140 mmol/L (ref 134–144)
Total Protein: 6.6 g/dL (ref 6.0–8.5)

## 2017-10-02 LAB — CBC WITH DIFFERENTIAL/PLATELET
BASOS: 0 %
Basophils Absolute: 0 10*3/uL (ref 0.0–0.2)
EOS (ABSOLUTE): 0.3 10*3/uL (ref 0.0–0.4)
EOS: 3 %
HEMATOCRIT: 40.7 % (ref 34.0–46.6)
HEMOGLOBIN: 13.7 g/dL (ref 11.1–15.9)
IMMATURE GRANS (ABS): 0 10*3/uL (ref 0.0–0.1)
IMMATURE GRANULOCYTES: 0 %
Lymphocytes Absolute: 4 10*3/uL — ABNORMAL HIGH (ref 0.7–3.1)
Lymphs: 33 %
MCH: 29.5 pg (ref 26.6–33.0)
MCHC: 33.7 g/dL (ref 31.5–35.7)
MCV: 88 fL (ref 79–97)
MONOCYTES: 7 %
Monocytes Absolute: 0.8 10*3/uL (ref 0.1–0.9)
NEUTROS PCT: 57 %
Neutrophils Absolute: 6.9 10*3/uL (ref 1.4–7.0)
Platelets: 323 10*3/uL (ref 150–450)
RBC: 4.65 x10E6/uL (ref 3.77–5.28)
RDW: 13.2 % (ref 12.3–15.4)
WBC: 12.1 10*3/uL — ABNORMAL HIGH (ref 3.4–10.8)

## 2017-10-02 LAB — LIPID PANEL
CHOL/HDL RATIO: 3.9 ratio (ref 0.0–4.4)
Cholesterol, Total: 151 mg/dL (ref 100–199)
HDL: 39 mg/dL — AB (ref >39)
LDL CALC: 98 mg/dL (ref 0–99)
Triglycerides: 70 mg/dL (ref 0–149)
VLDL CHOLESTEROL CAL: 14 mg/dL (ref 5–40)

## 2017-10-02 LAB — TSH: TSH: 1.27 u[IU]/mL (ref 0.450–4.500)

## 2017-10-02 LAB — VITAMIN D 25 HYDROXY (VIT D DEFICIENCY, FRACTURES): Vit D, 25-Hydroxy: 25 ng/mL — ABNORMAL LOW (ref 30.0–100.0)

## 2017-10-02 MED ORDER — DICLOFENAC SODIUM 75 MG PO TBEC: 75.0000 mg | DELAYED_RELEASE_TABLET | Freq: Two times a day (BID) | ORAL | 3 refills | Status: DC

## 2017-10-02 NOTE — Telephone Encounter (Signed)
PT was seen yesterday in office, and she was told that some pain medication was suppose to have been called in but [facility] told her that only on RX was sent, pt is wondering if she needs to continue taking the indoprofen

## 2017-10-02 NOTE — Telephone Encounter (Signed)
Patient aware, script is ready.

## 2017-10-02 NOTE — Telephone Encounter (Signed)
Diclofenac Prescription sent to pharmacy. No other NSAID's and take with food.

## 2017-10-04 ENCOUNTER — Other Ambulatory Visit: Payer: Self-pay | Admitting: Family

## 2017-10-04 LAB — IGP, APTIMA HPV, RFX 16/18,45
HPV Aptima: NEGATIVE
PAP SMEAR COMMENT: 0

## 2017-10-04 MED ORDER — VITAMIN D (ERGOCALCIFEROL) 1.25 MG (50000 UNIT) PO CAPS: 50000.0000 [IU] | ORAL_CAPSULE | ORAL | 3 refills | Status: DC

## 2017-11-13 ENCOUNTER — Encounter: Payer: Self-pay | Admitting: Family

## 2017-11-15 ENCOUNTER — Other Ambulatory Visit: Payer: Self-pay | Admitting: Family

## 2017-11-15 MED ORDER — PHENTERMINE HCL 37.5 MG PO TABS: 37.5000 mg | ORAL_TABLET | Freq: Every day | ORAL | 2 refills | Status: DC

## 2018-02-26 ENCOUNTER — Ambulatory Visit: Payer: BC Managed Care – PPO | Admitting: Family

## 2018-02-26 ENCOUNTER — Encounter: Payer: Self-pay | Admitting: Family

## 2018-02-26 VITALS — BP 125/82 | HR 90 | Temp 97.3°F | Ht 60.0 in | Wt 193.0 lb

## 2018-02-26 DIAGNOSIS — Z713 Dietary counseling and surveillance: Secondary | ICD-10-CM

## 2018-02-26 DIAGNOSIS — E669 Obesity, unspecified: Secondary | ICD-10-CM

## 2018-02-26 MED ORDER — PHENTERMINE HCL 37.5 MG PO TABS: 37.5000 mg | ORAL_TABLET | Freq: Every day | ORAL | 2 refills | Status: DC

## 2018-02-26 NOTE — Patient Instructions (Signed)
Exercising to Lose Weight Exercising can help you to lose weight. In order to lose weight through exercise, you need to do vigorous-intensity exercise. You can tell that you are exercising with vigorous intensity if you are breathing very hard and fast and cannot hold a conversation while exercising. Moderate-intensity exercise helps to maintain your current weight. You can tell that you are exercising at a moderate level if you have a higher heart rate and faster breathing, but you are still able to hold a conversation. How often should I exercise? Choose an activity that you enjoy and set realistic goals. Your health care provider can help you to make an activity plan that works for you. Exercise regularly as directed by your health care provider. This may include:  Doing resistance training twice each week, such as: ? Push-ups. ? Sit-ups. ? Lifting weights. ? Using resistance bands.  Doing a given intensity of exercise for a given amount of time. Choose from these options: ? 150 minutes of moderate-intensity exercise every week. ? 75 minutes of vigorous-intensity exercise every week. ? A mix of moderate-intensity and vigorous-intensity exercise every week.  Children, pregnant women, people who are out of shape, people who are overweight, and older adults may need to consult a health care provider for individual recommendations. If you have any sort of medical condition, be sure to consult your health care provider before starting a new exercise program. What are some activities that can help me to lose weight?  Walking at a rate of at least 4.5 miles an hour.  Jogging or running at a rate of 5 miles per hour.  Biking at a rate of at least 10 miles per hour.  Lap swimming.  Roller-skating or in-line skating.  Cross-country skiing.  Vigorous competitive sports, such as football, basketball, and soccer.  Jumping rope.  Aerobic dancing. How can I be more active in my day-to-day  activities?  Use the stairs instead of the elevator.  Take a walk during your lunch break.  If you drive, park your car farther away from work or school.  If you take public transportation, get off one stop early and walk the rest of the way.  Make all of your phone calls while standing up and walking around.  Get up, stretch, and walk around every 30 minutes throughout the day. What guidelines should I follow while exercising?  Do not exercise so much that you hurt yourself, feel dizzy, or get very short of breath.  Consult your health care provider prior to starting a new exercise program.  Wear comfortable clothes and shoes with good support.  Drink plenty of water while you exercise to prevent dehydration or heat stroke. Body water is lost during exercise and must be replaced.  Work out until you breathe faster and your heart beats faster. This information is not intended to replace advice given to you by your health care provider. Make sure you discuss any questions you have with your health care provider. Document Released: 04/29/2010 Document Revised: 09/02/2015 Document Reviewed: 08/28/2013 Elsevier Interactive Patient Education  2018 Elsevier Inc.  

## 2018-02-26 NOTE — Progress Notes (Signed)
   Subjective:    Patient ID: Lauren [name], female    DOB: Mar 24, 1976, 42 y.o.   MRN: [id number]  Chief Complaint  Patient presents with  . weight management recheck    HPI PT presents to the office for follow up on weight loss after starting phentermine. She has lost 14 lbs since starting phentermine. She states she has tried to cut her carbs. She is states she was walking, but has got a second job and has not been exercising.  She would like to continue the phentermine at this time.   Review of Systems  All other systems reviewed and are negative.      Objective:   Physical Exam  Constitutional: She is oriented to person, place, and time. She appears well-developed and well-nourished. No distress.  HENT:  Head: Normocephalic and atraumatic.  Right Ear: External ear normal.  Left Ear: External ear normal.  Mouth/Throat: Oropharynx is clear and moist.  Eyes: Pupils are equal, round, and reactive to light.  Neck: Normal range of motion. Neck supple. No thyromegaly present.  Cardiovascular: Normal rate, regular rhythm, normal heart sounds and intact distal pulses.  No murmur heard. Pulmonary/Chest: Effort normal and breath sounds normal. No respiratory distress. She has no wheezes.  Abdominal: Soft. Bowel sounds are normal. She exhibits no distension. There is no tenderness.  Musculoskeletal: Normal range of motion. She exhibits no edema or tenderness.  Neurological: She is alert and oriented to person, place, and time. She has normal reflexes. No cranial nerve deficit.  Skin: Skin is warm and dry.  Psychiatric: She has a normal mood and affect. Her behavior is normal. Judgment and thought content normal.  Vitals reviewed.    BP 125/82   Pulse 90   Temp (!) 97.3 F (36.3 C) (Oral)   Ht 5' (1.524 m)   Wt 193 lb (87.5 kg)   LMP 08/18/2012   BMI 37.69 kg/m      Assessment & Plan:  Lauren [name] comes in today with chief complaint of weight management  recheck   Diagnosis and orders addressed:  1. Weight loss counseling, encounter for -We will continue phentermine Encouraged healthy diet and exercising on hour a day Must continue to lose weight, and needs to lose 5% of weight loss to continue RTO in 3 months  - CMP14+EGFR - phentermine (ADIPEX-P) 37.5 MG tablet; Take 1 tablet (37.5 mg total) by mouth daily before breakfast.  Dispense: 30 tablet; Refill: 2  2. Obesity (BMI 30-39.9) - CMP14+EGFR - phentermine (ADIPEX-P) 37.5 MG tablet; Take 1 tablet (37.5 mg total) by mouth daily before breakfast.  Dispense: 30 tablet; Refill: 2   Labs pending Health Maintenance reviewed Diet and exercise encouraged  Follow up plan: 3 months  [name], FNP

## 2018-02-27 LAB — CMP14+EGFR
ALK PHOS: 97 IU/L (ref 39–117)
ALT: 26 IU/L (ref 0–32)
AST: 19 IU/L (ref 0–40)
Albumin/Globulin Ratio: 1.4 (ref 1.2–2.2)
Albumin: 4 g/dL (ref 3.5–5.5)
BILIRUBIN TOTAL: 0.3 mg/dL (ref 0.0–1.2)
BUN/Creatinine Ratio: 19 (ref 9–23)
BUN: 11 mg/dL (ref 6–24)
CALCIUM: 9.2 mg/dL (ref 8.7–10.2)
CHLORIDE: 99 mmol/L (ref 96–106)
CO2: 19 mmol/L — ABNORMAL LOW (ref 20–29)
Creatinine, Ser: 0.58 mg/dL (ref 0.57–1.00)
GFR calc Af Amer: 131 mL/min/{1.73_m2} (ref >59)
GFR calc non Af Amer: 114 mL/min/{1.73_m2} (ref >59)
GLOBULIN, TOTAL: 2.8 g/dL (ref 1.5–4.5)
Glucose: 73 mg/dL (ref 65–99)
POTASSIUM: 4.3 mmol/L (ref 3.5–5.2)
Sodium: 134 mmol/L (ref 134–144)
TOTAL PROTEIN: 6.8 g/dL (ref 6.0–8.5)

## 2018-04-04 ENCOUNTER — Telehealth: Payer: Self-pay

## 2018-04-04 NOTE — Telephone Encounter (Signed)
Insurance denied prior auth for Phentermine   Only 3 months during whole year covered

## 2018-04-27 ENCOUNTER — Other Ambulatory Visit: Payer: Self-pay | Admitting: Family

## 2018-04-27 DIAGNOSIS — J4521 Mild intermittent asthma with (acute) exacerbation: Secondary | ICD-10-CM

## 2018-07-24 ENCOUNTER — Other Ambulatory Visit: Payer: Self-pay | Admitting: Family

## 2018-07-24 DIAGNOSIS — J4521 Mild intermittent asthma with (acute) exacerbation: Secondary | ICD-10-CM

## 2018-07-24 NOTE — Telephone Encounter (Signed)
OV 10/01/17 rtc 1 yr

## 2018-11-07 ENCOUNTER — Other Ambulatory Visit: Payer: Self-pay | Admitting: *Deleted

## 2018-11-07 ENCOUNTER — Other Ambulatory Visit: Payer: Self-pay | Admitting: Family

## 2018-11-07 DIAGNOSIS — J4521 Mild intermittent asthma with (acute) exacerbation: Secondary | ICD-10-CM

## 2018-11-07 MED ORDER — MONTELUKAST SODIUM 10 MG PO TABS: ORAL_TABLET | ORAL | 0 refills | Status: DC

## 2018-11-07 NOTE — Telephone Encounter (Signed)
[facility] NTBS yrly OV was to be in June

## 2018-11-11 ENCOUNTER — Other Ambulatory Visit: Payer: Self-pay | Admitting: Family

## 2019-01-31 DIAGNOSIS — J4521 Mild intermittent asthma with (acute) exacerbation: Secondary | ICD-10-CM

## 2019-01-31 MED ORDER — VITAMIN D (ERGOCALCIFEROL) 1.25 MG (50000 UNIT) PO CAPS: ORAL_CAPSULE | ORAL | 0 refills | Status: DC

## 2019-01-31 MED ORDER — MONTELUKAST SODIUM 10 MG PO TABS: ORAL_TABLET | ORAL | 0 refills | Status: DC

## 2019-02-08 ENCOUNTER — Other Ambulatory Visit: Payer: Self-pay | Admitting: Family

## 2019-02-08 DIAGNOSIS — J4521 Mild intermittent asthma with (acute) exacerbation: Secondary | ICD-10-CM

## 2019-03-05 ENCOUNTER — Other Ambulatory Visit: Payer: Self-pay

## 2019-03-05 DIAGNOSIS — Z20822 Contact with and (suspected) exposure to covid-19: Secondary | ICD-10-CM

## 2019-03-07 LAB — NOVEL CORONAVIRUS, NAA: SARS-CoV-2, NAA: NOT DETECTED

## 2019-05-01 ENCOUNTER — Other Ambulatory Visit: Payer: Self-pay | Admitting: Physician Assistant

## 2019-05-01 NOTE — Telephone Encounter (Signed)
Left detailed message- call back to schedule apt.

## 2019-05-01 NOTE — Telephone Encounter (Signed)
ntbs Last seen over a year ago

## 2019-10-09 ENCOUNTER — Encounter: Payer: BC Managed Care – PPO | Admitting: Family

## 2019-10-14 ENCOUNTER — Ambulatory Visit (INDEPENDENT_AMBULATORY_CARE_PROVIDER_SITE_OTHER): Payer: BC Managed Care – PPO | Admitting: Family

## 2019-10-14 ENCOUNTER — Other Ambulatory Visit: Payer: Self-pay

## 2019-10-14 ENCOUNTER — Other Ambulatory Visit (HOSPITAL_COMMUNITY)
Admission: RE | Admit: 2019-10-14 | Discharge: 2019-10-14 | Disposition: A | Discharge status: Home / Self Care | Payer: BC Managed Care – PPO | Source: Ambulatory Visit | Attending: Family | Admitting: Family

## 2019-10-14 ENCOUNTER — Encounter: Payer: Self-pay | Admitting: Family

## 2019-10-14 VITALS — BP 130/89 | HR 87 | Temp 98.1°F | Ht 61.0 in | Wt 208.4 lb

## 2019-10-14 DIAGNOSIS — J4521 Mild intermittent asthma with (acute) exacerbation: Secondary | ICD-10-CM

## 2019-10-14 DIAGNOSIS — R609 Edema, unspecified: Secondary | ICD-10-CM

## 2019-10-14 DIAGNOSIS — Z0001 Encounter for general adult medical examination with abnormal findings: Secondary | ICD-10-CM | POA: Diagnosis not present

## 2019-10-14 DIAGNOSIS — Z01419 Encounter for gynecological examination (general) (routine) without abnormal findings: Secondary | ICD-10-CM | POA: Diagnosis present

## 2019-10-14 DIAGNOSIS — Z0000 Encounter for general adult medical examination without abnormal findings: Secondary | ICD-10-CM

## 2019-10-14 DIAGNOSIS — Z1159 Encounter for screening for other viral diseases: Secondary | ICD-10-CM

## 2019-10-14 DIAGNOSIS — E559 Vitamin D deficiency, unspecified: Secondary | ICD-10-CM

## 2019-10-14 DIAGNOSIS — E785 Hyperlipidemia, unspecified: Secondary | ICD-10-CM | POA: Diagnosis not present

## 2019-10-14 DIAGNOSIS — L709 Acne, unspecified: Secondary | ICD-10-CM

## 2019-10-14 DIAGNOSIS — E669 Obesity, unspecified: Secondary | ICD-10-CM

## 2019-10-14 DIAGNOSIS — B002 Herpesviral gingivostomatitis and pharyngotonsillitis: Secondary | ICD-10-CM | POA: Insufficient documentation

## 2019-10-14 DIAGNOSIS — B009 Herpesviral infection, unspecified: Secondary | ICD-10-CM

## 2019-10-14 MED ORDER — ALBUTEROL SULFATE HFA 108 (90 BASE) MCG/ACT IN AERS: 2.0000 | INHALATION_SPRAY | Freq: Four times a day (QID) | RESPIRATORY_TRACT | 3 refills | Status: DC | PRN

## 2019-10-14 MED ORDER — CLINDAMYCIN PHOS-BENZOYL PEROX 1-5 % EX GEL: Freq: Two times a day (BID) | CUTANEOUS | 3 refills | Status: DC

## 2019-10-14 MED ORDER — VALACYCLOVIR HCL 1 G PO TABS: 2000.0000 mg | ORAL_TABLET | Freq: Two times a day (BID) | ORAL | 5 refills | Status: DC

## 2019-10-14 MED ORDER — MONTELUKAST SODIUM 10 MG PO TABS: 10.0000 mg | ORAL_TABLET | Freq: Every day | ORAL | 4 refills | Status: DC

## 2019-10-14 NOTE — Progress Notes (Signed)
Subjective:    Patient ID: Lauren [name], female    DOB: 04/26/1975, 44 y.o.   MRN: [id number]  Chief Complaint  Patient presents with   Annual Exam    Patient wants pap   Oral Swelling    painful    Foot Swelling   Pt presents to the office today for CPE with pap. She reports during the hot weather and drinking lots of water she has noticed increased swelling in bilateral ankles.  She is complaining of cold sores on top lip. She reports 3 in the last month.  Asthma There is no cough or wheezing. This is a chronic problem. The current episode started more than 1 year ago. The problem occurs intermittently. Her symptoms are alleviated by rest and beta-agonist. She reports minimal improvement on treatment. Her past medical history is significant for asthma.  Hyperlipidemia This is a chronic problem. The current episode started more than 1 year ago. The problem is uncontrolled. Recent lipid tests were reviewed and are high. Exacerbating diseases include obesity. Current antihyperlipidemic treatment includes diet change. The current treatment provides mild improvement of lipids. Compliance problems include adherence to exercise.   Gynecologic Exam The patient's pertinent negatives include no genital itching, genital lesions, genital rash or missed menses. This is a chronic problem.      Review of Systems  Respiratory: Negative for cough and wheezing.   Genitourinary: Negative for missed menses.  All other systems reviewed and are negative.  Family History  Problem Relation Age of Onset   Heart disease Father    Diabetes Father    Social History   Socioeconomic History   Marital status: Married    Spouse name: Not on file   Number of children: Not on file   Years of education: Not on file   Highest education level: Not on file  Occupational History   Not on file  Tobacco Use   Smoking status: Never Smoker   Smokeless tobacco: Never Used  Substance and Sexual  Activity   Alcohol use: No   Drug use: No   Sexual activity: Yes    Birth control/protection: Surgical  Other Topics Concern   Not on file  Social History Narrative   Not on file   Social Determinants of Health   Financial Resource Strain:    Difficulty of Paying Living Expenses:   Food Insecurity:    Worried About Charity fundraiser in the Last Year:    Arboriculturist in the Last Year:   Transportation Needs:    Film/video editor (Medical):    Lack of Transportation (Non-Medical):   Physical Activity:    Days of Exercise per Week:    Minutes of Exercise per Session:   Stress:    Feeling of Stress :   Social Connections:    Frequency of Communication with Friends and Family:    Frequency of Social Gatherings with Friends and Family:    Attends Religious Services:    Active Member of Clubs or Organizations:    Attends Archivist Meetings:    Marital Status:        Objective:   Physical Exam Vitals reviewed.  Constitutional:      General: She is not in acute distress.    Appearance: She is well-developed. She is obese.  HENT:     Head: Normocephalic and atraumatic.     Right Ear: Tympanic membrane normal.     Left Ear: Tympanic membrane  normal.  Eyes:     Pupils: Pupils are equal, round, and reactive to light.  Neck:     Thyroid: No thyromegaly.  Cardiovascular:     Rate and Rhythm: Normal rate and regular rhythm.     Heart sounds: Normal heart sounds. No murmur heard.   Pulmonary:     Effort: Pulmonary effort is normal. No respiratory distress.     Breath sounds: Normal breath sounds. No wheezing.  Chest:     Breasts:        Right: No swelling, bleeding, inverted nipple, mass, nipple discharge, skin change or tenderness.        Left: No swelling, bleeding, inverted nipple, mass, nipple discharge, skin change or tenderness.  Abdominal:     General: Bowel sounds are normal. There is no distension.     Palpations: Abdomen  is soft.     Tenderness: There is no abdominal tenderness.  Genitourinary:    General: Normal vulva.     Comments: Bimanual exam- no adnexal masses or tenderness, ovaries nonpalpable   Cervix parous and pink- No discharge  Musculoskeletal:        General: No tenderness. Normal range of motion.     Cervical back: Normal range of motion and neck supple.  Skin:    General: Skin is warm and dry.  Neurological:     Mental Status: She is alert and oriented to person, place, and time.     Cranial Nerves: No cranial nerve deficit.     Deep Tendon Reflexes: Reflexes are normal and symmetric.  Psychiatric:        Behavior: Behavior normal.        Thought Content: Thought content normal.        Judgment: Judgment normal.       BP 130/89    Pulse 87    Temp 98.1 F (36.7 C) (Oral)    Ht 5' 1"  (1.549 m)    Wt 208 lb 6.4 oz (94.5 kg)    LMP 08/18/2012    BMI 39.38 kg/m      Assessment & Plan:  ELOWYN RAUPP comes in today with chief complaint of Annual Exam (Patient wants pap), Oral Swelling (painful ), and Foot Swelling   Diagnosis and orders addressed:  1. Annual physical exam - CMP14+EGFR - CBC with Differential/Platelet - Lipid panel - TSH - VITAMIN D 25 Hydroxy (Vit-D Deficiency, Fractures) - Hepatitis C antibody  2. Mild intermittent asthma with acute exacerbation - CMP14+EGFR - CBC with Differential/Platelet - montelukast (SINGULAIR) 10 MG tablet; Take 1 tablet (10 mg total) by mouth at bedtime.  Dispense: 90 tablet; Refill: 4 - albuterol (PROVENTIL HFA) 108 (90 Base) MCG/ACT inhaler; Inhale 2 puffs into the lungs every 6 (six) hours as needed for wheezing or shortness of breath.  Dispense: 8 g; Refill: 3  3. Hyperlipidemia, unspecified hyperlipidemia type - CMP14+EGFR - CBC with Differential/Platelet - Lipid panel  4. Obesity (BMI 30-39.9) - CMP14+EGFR - CBC with Differential/Platelet  5. Vitamin D deficiency - CMP14+EGFR - CBC with Differential/Platelet -  VITAMIN D 25 Hydroxy (Vit-D Deficiency, Fractures)  6. Peripheral edema -Low salt diet Compression hose as needed Keep elevated  - Compression stockings - CMP14+EGFR - CBC with Differential/Platelet  7. Acne, unspecified acne type Keep clean and dry - CMP14+EGFR - CBC with Differential/Platelet - clindamycin-benzoyl peroxide (BENZACLIN) gel; Apply topically 2 (two) times daily.  Dispense: 50 g; Refill: 3  8. Herpes simplex Will give Valtrex as needed - CMP14+EGFR -  CBC with Differential/Platelet - valACYclovir (VALTREX) 1000 MG tablet; Take 2 tablets (2,000 mg total) by mouth 2 (two) times daily.  Dispense: 4 tablet; Refill: 5  9. Gynecologic exam normal - Cytology - PAP([facility]) - CMP14+EGFR - CBC with Differential/Platelet  10. Need for hepatitis C screening test - Hepatitis C antibody   Labs pending Health Maintenance reviewed Diet and exercise encouraged  Follow up plan: 1 year    [name], FNP

## 2019-10-14 NOTE — Patient Instructions (Signed)
Peripheral Edema  Peripheral edema is swelling that is caused by a buildup of fluid. Peripheral edema most often affects the lower legs, ankles, and feet. It can also develop in the arms, hands, and face. The area of the body that has peripheral edema will look swollen. It may also feel heavy or warm. Your clothes may start to feel tight. Pressing on the area may make a temporary dent in your skin. You may not be able to move your swollen arm or leg as much as usual. There are many causes of peripheral edema. It can happen because of a complication of other conditions such as congestive heart failure, kidney disease, or a problem with your blood circulation. It also can be a side effect of certain medicines or because of an infection. It often happens to women during pregnancy. Sometimes, the cause is not known. Follow these instructions at home: Managing pain, stiffness, and swelling   Raise (elevate) your legs while you are sitting or lying down.  Move around often to prevent stiffness and to lessen swelling.  Do not sit or stand for long periods of time.  Wear support stockings as told by your health care provider. Medicines  Take over-the-counter and prescription medicines only as told by your health care provider.  Your health care provider may prescribe medicine to help your body get rid of excess water (diuretic). General instructions  Pay attention to any changes in your symptoms.  Follow instructions from your health care provider about limiting salt (sodium) in your diet. Sometimes, eating less salt may reduce swelling.  Moisturize skin daily to help prevent skin from cracking and draining.  Keep all follow-up visits as told by your health care provider. This is important. Contact a health care provider if you have:  A fever.  Edema that starts suddenly or is getting worse, especially if you are pregnant or have a medical condition.  Swelling in only one leg.  Increased  swelling, redness, or pain in one or both of your legs.  Drainage or sores at the area where you have edema. Get help right away if you:  Develop shortness of breath, especially when you are lying down.  Have pain in your chest or abdomen.  Feel weak.  Feel faint. Summary  Peripheral edema is swelling that is caused by a buildup of fluid. Peripheral edema most often affects the lower legs, ankles, and feet.  Move around often to prevent stiffness and to lessen swelling. Do not sit or stand for long periods of time.  Pay attention to any changes in your symptoms.  Contact a health care provider if you have edema that starts suddenly or is getting worse, especially if you are pregnant or have a medical condition.  Get help right away if you develop shortness of breath, especially when lying down. This information is not intended to replace advice given to you by your health care provider. Make sure you discuss any questions you have with your health care provider. Document Revised: 12/19/2017 Document Reviewed: 12/19/2017 Elsevier Patient Education  [place].

## 2019-10-15 LAB — CMP14+EGFR
ALT: 18 IU/L (ref 0–32)
AST: 16 IU/L (ref 0–40)
Albumin/Globulin Ratio: 1.4 (ref 1.2–2.2)
Albumin: 4.1 g/dL (ref 3.8–4.8)
Alkaline Phosphatase: 96 IU/L (ref 48–121)
BUN/Creatinine Ratio: 20 (ref 9–23)
BUN: 14 mg/dL (ref 6–24)
Bilirubin Total: <0.2 mg/dL (ref 0.0–1.2)
CO2: 23 mmol/L (ref 20–29)
Calcium: 9.3 mg/dL (ref 8.7–10.2)
Chloride: 103 mmol/L (ref 96–106)
Creatinine, Ser: 0.7 mg/dL (ref 0.57–1.00)
GFR calc Af Amer: 122 mL/min/{1.73_m2} (ref >59)
GFR calc non Af Amer: 106 mL/min/{1.73_m2} (ref >59)
Globulin, Total: 2.9 g/dL (ref 1.5–4.5)
Glucose: 88 mg/dL (ref 65–99)
Potassium: 4.4 mmol/L (ref 3.5–5.2)
Sodium: 137 mmol/L (ref 134–144)
Total Protein: 7 g/dL (ref 6.0–8.5)

## 2019-10-15 LAB — TSH: TSH: 1.14 u[IU]/mL (ref 0.450–4.500)

## 2019-10-15 LAB — CBC WITH DIFFERENTIAL/PLATELET
Basophils Absolute: 0.1 10*3/uL (ref 0.0–0.2)
Basos: 1 %
EOS (ABSOLUTE): 0.2 10*3/uL (ref 0.0–0.4)
Eos: 3 %
Hematocrit: 42.4 % (ref 34.0–46.6)
Hemoglobin: 14.5 g/dL (ref 11.1–15.9)
Immature Grans (Abs): 0 10*3/uL (ref 0.0–0.1)
Immature Granulocytes: 0 %
Lymphocytes Absolute: 3.1 10*3/uL (ref 0.7–3.1)
Lymphs: 33 %
MCH: 30.9 pg (ref 26.6–33.0)
MCHC: 34.2 g/dL (ref 31.5–35.7)
MCV: 90 fL (ref 79–97)
Monocytes Absolute: 0.5 10*3/uL (ref 0.1–0.9)
Monocytes: 5 %
Neutrophils Absolute: 5.5 10*3/uL (ref 1.4–7.0)
Neutrophils: 58 %
Platelets: 264 10*3/uL (ref 150–450)
RBC: 4.69 x10E6/uL (ref 3.77–5.28)
RDW: 12.1 % (ref 11.7–15.4)
WBC: 9.4 10*3/uL (ref 3.4–10.8)

## 2019-10-15 LAB — LIPID PANEL
Chol/HDL Ratio: 4.5 ratio — ABNORMAL HIGH (ref 0.0–4.4)
Cholesterol, Total: 194 mg/dL (ref 100–199)
HDL: 43 mg/dL (ref >39)
LDL Chol Calc (NIH): 135 mg/dL — ABNORMAL HIGH (ref 0–99)
Triglycerides: 85 mg/dL (ref 0–149)
VLDL Cholesterol Cal: 16 mg/dL (ref 5–40)

## 2019-10-15 LAB — VITAMIN D 25 HYDROXY (VIT D DEFICIENCY, FRACTURES): Vit D, 25-Hydroxy: 24.8 ng/mL — ABNORMAL LOW (ref 30.0–100.0)

## 2019-10-15 LAB — HEPATITIS C ANTIBODY: Hep C Virus Ab: <0.1 s/co ratio (ref 0.0–0.9)

## 2019-10-16 LAB — CYTOLOGY - PAP
Adequacy: ABSENT
Diagnosis: NEGATIVE

## 2019-10-21 ENCOUNTER — Other Ambulatory Visit: Payer: Self-pay | Admitting: Family

## 2019-10-21 MED ORDER — VITAMIN D (ERGOCALCIFEROL) 1.25 MG (50000 UNIT) PO CAPS
50000.0000 [IU] | ORAL_CAPSULE | ORAL | 3 refills | Status: DC
Start: 2019-10-21 — End: 2020-09-30

## 2019-10-24 ENCOUNTER — Other Ambulatory Visit: Payer: Self-pay | Admitting: Family

## 2019-10-24 DIAGNOSIS — Z1231 Encounter for screening mammogram for malignant neoplasm of breast: Secondary | ICD-10-CM

## 2019-12-03 ENCOUNTER — Ambulatory Visit
Admission: RE | Admit: 2019-12-03 | Discharge: 2019-12-03 | Disposition: A | Discharge status: Home / Self Care | Payer: BC Managed Care – PPO | Source: Ambulatory Visit | Attending: Family | Admitting: Family

## 2019-12-03 DIAGNOSIS — Z1231 Encounter for screening mammogram for malignant neoplasm of breast: Secondary | ICD-10-CM

## 2019-12-05 ENCOUNTER — Other Ambulatory Visit: Payer: Self-pay | Admitting: Family

## 2019-12-05 DIAGNOSIS — R928 Other abnormal and inconclusive findings on diagnostic imaging of breast: Secondary | ICD-10-CM

## 2019-12-22 ENCOUNTER — Ambulatory Visit
Admission: RE | Admit: 2019-12-22 | Discharge: 2019-12-22 | Disposition: A | Discharge status: Home / Self Care | Payer: BC Managed Care – PPO | Source: Ambulatory Visit | Attending: Family | Admitting: Family

## 2019-12-22 ENCOUNTER — Other Ambulatory Visit: Payer: Self-pay | Admitting: Family

## 2019-12-22 ENCOUNTER — Other Ambulatory Visit: Payer: Self-pay

## 2019-12-22 DIAGNOSIS — R928 Other abnormal and inconclusive findings on diagnostic imaging of breast: Secondary | ICD-10-CM

## 2020-06-21 ENCOUNTER — Ambulatory Visit
Admission: RE | Admit: 2020-06-21 | Discharge: 2020-06-21 | Disposition: A | Discharge status: Home / Self Care | Payer: Self-pay | Source: Ambulatory Visit | Attending: Family | Admitting: Family

## 2020-06-21 ENCOUNTER — Ambulatory Visit
Admission: RE | Admit: 2020-06-21 | Discharge: 2020-06-21 | Disposition: A | Discharge status: Home / Self Care | Payer: BC Managed Care – PPO | Source: Ambulatory Visit | Attending: Family | Admitting: Family

## 2020-06-21 ENCOUNTER — Other Ambulatory Visit: Payer: Self-pay | Admitting: Family

## 2020-06-21 ENCOUNTER — Other Ambulatory Visit: Payer: Self-pay

## 2020-06-21 DIAGNOSIS — R928 Other abnormal and inconclusive findings on diagnostic imaging of breast: Secondary | ICD-10-CM

## 2020-09-24 ENCOUNTER — Ambulatory Visit: Payer: BC Managed Care – PPO | Admitting: Nurse Practitioner

## 2020-09-24 ENCOUNTER — Other Ambulatory Visit: Payer: Self-pay

## 2020-09-24 ENCOUNTER — Encounter: Payer: Self-pay | Admitting: Nurse Practitioner

## 2020-09-24 VITALS — BP 142/90 | HR 85 | Temp 98.0°F | Resp 20 | Ht 61.0 in | Wt 216.0 lb

## 2020-09-24 DIAGNOSIS — I10 Essential (primary) hypertension: Secondary | ICD-10-CM

## 2020-09-24 DIAGNOSIS — H6982 Other specified disorders of Eustachian tube, left ear: Secondary | ICD-10-CM

## 2020-09-24 MED ORDER — HYDROCHLOROTHIAZIDE 12.5 MG PO CAPS: 12.5000 mg | ORAL_CAPSULE | Freq: Every day | ORAL | 1 refills | Status: DC

## 2020-09-24 MED ORDER — FLUTICASONE PROPIONATE 50 MCG/ACT NA SUSP: 2.0000 | Freq: Every day | NASAL | 6 refills | Status: DC

## 2020-09-24 NOTE — Patient Instructions (Signed)
[email or web address]">  DASH Eating Plan DASH stands for Dietary Approaches to Stop Hypertension. The DASH eating plan is a healthy eating plan that has been shown to: Reduce high blood pressure (hypertension). Reduce your risk for type 2 diabetes, heart disease, and stroke. Help with weight loss. What are tips for following this plan? Reading food labels Check food labels for the amount of salt (sodium) per serving. Choose foods with less than 5 percent of the Daily Value of sodium. Generally, foods with less than 300 milligrams (mg) of sodium per serving fit into this eating plan. To find whole grains, look for the word "whole" as the first word in the ingredient list. Shopping Buy products labeled as "low-sodium" or "no salt added." Buy fresh foods. Avoid canned foods and pre-made or frozen meals. Cooking Avoid adding salt when cooking. Use salt-free seasonings or herbs instead of table salt or sea salt. Check with your health care provider or pharmacist before using salt substitutes. Do not fry foods. Cook foods using healthy methods such as baking, boiling, grilling, roasting, and broiling instead. Cook with heart-healthy oils, such as olive, canola, avocado, soybean, or sunflower oil. Meal planning  Eat a balanced diet that includes: 4 or more servings of fruits and 4 or more servings of vegetables each day. Try to fill one-half of your plate with fruits and vegetables. 6-8 servings of whole grains each day. Less than 6 oz (170 g) of lean meat, poultry, or fish each day. A 3-oz (85-g) serving of meat is about the same size as a deck of cards. One egg equals 1 oz (28 g). 2-3 servings of low-fat dairy each day. One serving is 1 cup (237 mL). 1 serving of nuts, seeds, or beans 5 times each week. 2-3 servings of heart-healthy fats. Healthy fats called omega-3 fatty acids are found in foods such as walnuts, flaxseeds, fortified milks, and eggs.  These fats are also found in cold-water fish, such as sardines, salmon, and mackerel. Limit how much you eat of: Canned or prepackaged foods. Food that is high in trans fat, such as some fried foods. Food that is high in saturated fat, such as fatty meat. Desserts and other sweets, sugary drinks, and other foods with added sugar. Full-fat dairy products. Do not salt foods before eating. Do not eat more than 4 egg yolks a week. Try to eat at least 2 vegetarian meals a week. Eat more home-cooked food and less restaurant, buffet, and fast food.  Lifestyle When eating at a restaurant, ask that your food be prepared with less salt or no salt, if possible. If you drink alcohol: Limit how much you use to: 0-1 drink a day for women who are not pregnant. 0-2 drinks a day for men. Be aware of how much alcohol is in your drink. In the [place] one drink equals one 12 oz bottle of beer (355 mL), one 5 oz glass of wine (148 mL), or one 1 oz glass of hard liquor (44 mL). General information Avoid eating more than 2,300 mg of salt a day. If you have hypertension, you may need to reduce your sodium intake to 1,500 mg a day. Work with your health care provider to maintain a healthy body weight or to lose weight. Ask what an ideal weight is for you. Get at least 30 minutes of exercise that causes your heart to beat faster (aerobic exercise) most days of the week. Activities may include walking, swimming, or biking. Work with your health care provider  or dietitian to adjust your eating plan to your individual calorie needs. What foods should I eat? Fruits All fresh, dried, or frozen fruit. Canned fruit in natural juice (without addedsugar). Vegetables Fresh or frozen vegetables (raw, steamed, roasted, or grilled). Low-sodium or reduced-sodium tomato and vegetable juice. Low-sodium or reduced-sodium tomatosauce and tomato paste. Low-sodium or reduced-sodium canned vegetables. Grains Whole-grain or  whole-wheat bread. Whole-grain or whole-wheat pasta. Brown rice. [place]. Bulgur. Whole-grain and low-sodium cereals. Pita bread.Low-fat, low-sodium crackers. Whole-wheat flour tortillas. Meats and other proteins Skinless chicken or [place]. Ground chicken or [place]. Pork with fat trimmed off. Fish and seafood. Egg whites. Dried beans, peas, or lentils. Unsalted nuts, nut butters, and seeds. Unsalted canned beans. Lean cuts of beef with fat trimmed off. Low-sodium, lean precooked or cured meat, such as sausages or meatloaves. Dairy Low-fat (1%) or fat-free (skim) milk. Reduced-fat, low-fat, or fat-free cheeses. Nonfat, low-sodium ricotta or cottage cheese. Low-fat or nonfatyogurt. Low-fat, low-sodium cheese. Fats and oils Soft margarine without trans fats. Vegetable oil. Reduced-fat, low-fat, or light mayonnaise and salad dressings (reduced-sodium). Canola, safflower, olive, avocado, soybean, andsunflower oils. Avocado. Seasonings and condiments Herbs. Spices. Seasoning mixes without salt. Other foods Unsalted popcorn and pretzels. Fat-free sweets. The items listed above may not be a complete list of foods and beverages you can eat. Contact a dietitian for more information. What foods should I avoid? Fruits Canned fruit in a light or heavy syrup. Fried fruit. Fruit in cream or buttersauce. Vegetables Creamed or fried vegetables. Vegetables in a cheese sauce. Regular canned vegetables (not low-sodium or reduced-sodium). Regular canned tomato sauce and paste (not low-sodium or reduced-sodium). Regular tomato and vegetable juice(not low-sodium or reduced-sodium). Angie Fava. Olives. Grains Baked goods made with fat, such as croissants, muffins, or some breads. Drypasta or rice meal packs. Meats and other proteins Fatty cuts of meat. Ribs. Fried meat. Berniece Salines. Bologna, salami, and other precooked or cured meats, such as sausages or meat loaves. Fat from the back of a pig (fatback). Bratwurst.  Salted nuts and seeds. Canned beans with added salt. Canned orsmoked fish. Whole eggs or egg yolks. Chicken or [place] with skin. Dairy Whole or 2% milk, cream, and half-and-half. Whole or full-fat cream cheese. Whole-fat or sweetened yogurt. Full-fat cheese. Nondairy creamers. Whippedtoppings. Processed cheese and cheese spreads. Fats and oils Butter. Stick margarine. Lard. Shortening. Ghee. Bacon fat. Tropical oils, suchas coconut, palm kernel, or palm oil. Seasonings and condiments Onion salt, garlic salt, seasoned salt, table salt, and sea salt. Worcestershire sauce. Tartar sauce. Barbecue sauce. Teriyaki sauce. Soy sauce, including reduced-sodium. Steak sauce. Canned and packaged gravies. Fish sauce. Oyster sauce. Cocktail sauce. Store-bought horseradish. Ketchup. Mustard. Meat flavorings and tenderizers. Bouillon cubes. Hot sauces. Pre-made or packaged marinades. Pre-made or packaged taco seasonings. Relishes. Regular saladdressings. Other foods Salted popcorn and pretzels. The items listed above may not be a complete list of foods and beverages you should avoid. Contact a dietitian for more information. Where to find more information National Heart, Lung, and Blood Institute: https://wilson-eaton.com/ American Heart Association: www.heart.org Academy of Nutrition and Dietetics: www.eatright.[place]: www.kidney.org Summary The DASH eating plan is a healthy eating plan that has been shown to reduce high blood pressure (hypertension). It may also reduce your risk for type 2 diabetes, heart disease, and stroke. When on the DASH eating plan, aim to eat more fresh fruits and vegetables, whole grains, lean proteins, low-fat dairy, and heart-healthy fats. With the DASH eating plan, you should limit salt (sodium) intake to 2,300  mg a day. If you have hypertension, you may need to reduce your sodium intake to 1,500 mg a day. Work with your health care provider or dietitian to adjust  your eating plan to your individual calorie needs. This information is not intended to replace advice given to you by your health care provider. Make sure you discuss any questions you have with your healthcare provider. Document Revised: 02/28/2019 Document Reviewed: 02/28/2019 Elsevier Patient Education  2022 [employer].

## 2020-09-24 NOTE — Progress Notes (Signed)
   Subjective:    Patient ID: Lauren [name], female    DOB: 05-16-1975, 45 y.o.   MRN: [id number]   Chief Complaint: Blood Pressure Check and left ear feels stopped up   HPI Patient come sin today with 2 complaints: - left ear feels stopped up- started several months ago. Denies pain. Decrease hearing on left side. - elevated blood pressure. She checks it at ome and runs 138/88. Occasionally has systolic readings above 749, but usually when she is stressed. BP Readings from Last 3 Encounters:  09/24/20 (!) 142/90  10/14/19 130/89  02/26/18 125/82       Review of Systems  Constitutional:  Negative for diaphoresis.  Eyes:  Negative for pain.  Respiratory:  Negative for shortness of breath.   Cardiovascular:  Negative for chest pain, palpitations and leg swelling.  Gastrointestinal:  Negative for abdominal pain.  Endocrine: Negative for polydipsia.  Skin:  Negative for rash.  Neurological:  Negative for dizziness, weakness and headaches.  Hematological:  Does not bruise/bleed easily.  All other systems reviewed and are negative.     Objective:   Physical Exam Vitals and nursing note reviewed.  Constitutional:      Appearance: Normal appearance. She is obese.  HENT:     Right Ear: Tympanic membrane normal.     Left Ear: Tympanic membrane normal.     Ears:     Comments: Clear effusion on left    Mouth/Throat:     Mouth: Mucous membranes are moist.  Cardiovascular:     Rate and Rhythm: Normal rate and regular rhythm.     Heart sounds: Normal heart sounds.  Pulmonary:     Effort: Pulmonary effort is normal.     Breath sounds: Normal breath sounds.  Neurological:     General: No focal deficit present.     Mental Status: She is alert and oriented to person, place, and time.  Psychiatric:        Mood and Affect: Mood normal.        Behavior: Behavior normal.  BP (!) 142/90   Pulse 85   Temp 98 F (36.7 C) (Temporal)   Resp 20   Ht 5' 1"  (1.549 m)   Wt 216 lb (98  kg)   LMP 08/18/2012   SpO2 94%   BMI 40.81 kg/m          Assessment & Plan:   Lauren [name] in today with chief complaint of Blood Pressure Check and left ear feels stopped up   1. Primary hypertension Dash diet Added hctz Keep diary of blood pressure - hydrochlorothiazide (MICROZIDE) 12.5 MG capsule; Take 1 capsule (12.5 mg total) by mouth daily.  Dispense: 90 capsule; Refill: 1  2. Dysfunction of left eustachian tube Force fluids'OTC decongestant - fluticasone (FLONASE) 50 MCG/ACT nasal spray; Place 2 sprays into both nostrils daily.  Dispense: 16 g; Refill: 6    The above assessment and management plan was discussed with the patient. The patient verbalized understanding of and has agreed to the management plan. Patient is aware to call the clinic if symptoms persist or worsen. Patient is aware when to return to the clinic for a follow-up visit. Patient educated on when it is appropriate to go to the emergency department.   Mary-Margaret Hassell Done, FNP

## 2020-09-30 ENCOUNTER — Other Ambulatory Visit: Payer: Self-pay | Admitting: Family

## 2020-10-14 ENCOUNTER — Encounter: Payer: BC Managed Care – PPO | Admitting: Family

## 2020-10-26 ENCOUNTER — Ambulatory Visit: Payer: Self-pay | Admitting: Family

## 2020-11-03 ENCOUNTER — Other Ambulatory Visit: Payer: Self-pay | Admitting: Family

## 2020-11-03 ENCOUNTER — Encounter: Payer: Self-pay | Admitting: Family

## 2020-11-03 ENCOUNTER — Other Ambulatory Visit: Payer: Self-pay

## 2020-11-03 ENCOUNTER — Ambulatory Visit (INDEPENDENT_AMBULATORY_CARE_PROVIDER_SITE_OTHER): Payer: BC Managed Care – PPO | Admitting: Family

## 2020-11-03 VITALS — BP 130/83 | HR 83 | Temp 97.8°F | Ht 60.0 in | Wt 212.8 lb

## 2020-11-03 DIAGNOSIS — Z0001 Encounter for general adult medical examination with abnormal findings: Secondary | ICD-10-CM

## 2020-11-03 DIAGNOSIS — B002 Herpesviral gingivostomatitis and pharyngotonsillitis: Secondary | ICD-10-CM

## 2020-11-03 DIAGNOSIS — Z713 Dietary counseling and surveillance: Secondary | ICD-10-CM

## 2020-11-03 DIAGNOSIS — Z1211 Encounter for screening for malignant neoplasm of colon: Secondary | ICD-10-CM

## 2020-11-03 DIAGNOSIS — Z0000 Encounter for general adult medical examination without abnormal findings: Secondary | ICD-10-CM

## 2020-11-03 DIAGNOSIS — E559 Vitamin D deficiency, unspecified: Secondary | ICD-10-CM | POA: Diagnosis not present

## 2020-11-03 DIAGNOSIS — R5383 Other fatigue: Secondary | ICD-10-CM | POA: Diagnosis not present

## 2020-11-03 DIAGNOSIS — E785 Hyperlipidemia, unspecified: Secondary | ICD-10-CM

## 2020-11-03 DIAGNOSIS — L709 Acne, unspecified: Secondary | ICD-10-CM

## 2020-11-03 MED ORDER — TIRZEPATIDE 5 MG/0.5ML ~~LOC~~ SOAJ: 5.0000 mg | SUBCUTANEOUS | 0 refills | Status: DC

## 2020-11-03 MED ORDER — TIRZEPATIDE 2.5 MG/0.5ML ~~LOC~~ SOAJ: 2.5000 mg | SUBCUTANEOUS | 0 refills | Status: DC

## 2020-11-03 MED ORDER — CLINDAMYCIN PHOS-BENZOYL PEROX 1-5 % EX GEL: Freq: Two times a day (BID) | CUTANEOUS | 3 refills | Status: DC

## 2020-11-03 MED ORDER — VALACYCLOVIR HCL 1 G PO TABS: 2000.0000 mg | ORAL_TABLET | Freq: Two times a day (BID) | ORAL | 5 refills | Status: DC

## 2020-11-03 MED ORDER — VITAMIN D (ERGOCALCIFEROL) 1.25 MG (50000 UNIT) PO CAPS: 50000.0000 [IU] | ORAL_CAPSULE | ORAL | 0 refills | Status: DC

## 2020-11-03 NOTE — Progress Notes (Signed)
Subjective:    Patient ID: Lauren [name], female    DOB: December 17, 1975, 45 y.o.   MRN: [id number]  Chief Complaint  Patient presents with   Annual Exam    No pap, low energy and fatigue    Pt presents to the office today for CPE without pap. She is complaining of fatigue that started over the last few months. She graduated with her Masters in June and thought it was related to stress. She starts a new job on Monday.  Asthma There is no cough or hemoptysis. This is a chronic problem. The current episode started more than 1 year ago. The problem occurs intermittently. The problem has been waxing and waning. Her symptoms are alleviated by leukotriene antagonist. She reports moderate improvement on treatment. Her past medical history is significant for asthma.  Hyperlipidemia This is a chronic problem. The current episode started more than 1 year ago. The problem is uncontrolled. Exacerbating diseases include obesity. She is currently on no antihyperlipidemic treatment. The current treatment provides no improvement of lipids. Risk factors for coronary artery disease include dyslipidemia and a sedentary lifestyle.  Oral Herpes Takes Valtrex as needed. Last flare up was in May.    Review of Systems  Respiratory:  Negative for cough and hemoptysis.   All other systems reviewed and are negative.  Family History  Problem Relation Age of Onset   Heart disease Father    Diabetes Father    Social History   Socioeconomic History   Marital status: Married    Spouse name: Not on file   Number of children: Not on file   Years of education: Not on file   Highest education level: Not on file  Occupational History   Not on file  Tobacco Use   Smoking status: Never   Smokeless tobacco: Never  Substance and Sexual Activity   Alcohol use: No   Drug use: No   Sexual activity: Yes    Birth control/protection: Surgical  Other Topics Concern   Not on file  Social History Narrative   Not on  file   Social Determinants of Health   Financial Resource Strain: Not on file  Food Insecurity: Not on file  Transportation Needs: Not on file  Physical Activity: Not on file  Stress: Not on file  Social Connections: Not on file       Objective:   Physical Exam Vitals reviewed.  Constitutional:      General: She is not in acute distress.    Appearance: She is well-developed. She is obese.  HENT:     Head: Normocephalic and atraumatic.     Right Ear: Tympanic membrane normal.     Left Ear: Tympanic membrane normal.  Eyes:     Pupils: Pupils are equal, round, and reactive to light.  Neck:     Thyroid: No thyromegaly.  Cardiovascular:     Rate and Rhythm: Normal rate and regular rhythm.     Heart sounds: Normal heart sounds. No murmur heard. Pulmonary:     Effort: Pulmonary effort is normal. No respiratory distress.     Breath sounds: Normal breath sounds. No wheezing.  Abdominal:     General: Bowel sounds are normal. There is no distension.     Palpations: Abdomen is soft.     Tenderness: There is no abdominal tenderness.  Musculoskeletal:        General: No tenderness. Normal range of motion.     Cervical back: Normal range of  motion and neck supple.  Skin:    General: Skin is warm and dry.  Neurological:     Mental Status: She is alert and oriented to person, place, and time.     Cranial Nerves: No cranial nerve deficit.     Deep Tendon Reflexes: Reflexes are normal and symmetric.  Psychiatric:        Behavior: Behavior normal.        Thought Content: Thought content normal.        Judgment: Judgment normal.      BP 130/83   Pulse 83   Temp 97.8 F (36.6 C) (Temporal)   Ht 5' (1.524 m)   Wt 212 lb 12.8 oz (96.5 kg)   LMP 08/18/2012   BMI 41.56 kg/m      Assessment & Plan:  LINDAANN GRADILLA comes in today with chief complaint of Annual Exam (No pap, low energy and fatigue )   Diagnosis and orders addressed:  1. Acne, unspecified acne type -  clindamycin-benzoyl peroxide (BENZACLIN) gel; Apply topically 2 (two) times daily.  Dispense: 50 g; Refill: 3 - CMP14+EGFR  2. Morbid obesity ([place]) Start Mounjaro 2.5 mg for one month and then increase sto 5 mg  Encourage healthy diet and exercise - tirzepatide (MOUNJARO) 2.5 MG/0.5ML Pen; Inject 2.5 mg into the skin once a week.  Dispense: 2 mL; Refill: 0 - tirzepatide (MOUNJARO) 5 MG/0.5ML Pen; Inject 5 mg into the skin once a week.  Dispense: 6 mL; Refill: 0 - CMP14+EGFR  3. Vitamin D deficiency - Vitamin D, Ergocalciferol, (DRISDOL) 1.25 MG (50000 UNIT) CAPS capsule; Take 1 capsule (50,000 Units total) by mouth every 7 (seven) days.  Dispense: 12 capsule; Refill: 0 - CMP14+EGFR  4. Hyperlipidemia, unspecified hyperlipidemia type - CMP14+EGFR  5. Oral herpes - valACYclovir (VALTREX) 1000 MG tablet; Take 2 tablets (2,000 mg total) by mouth 2 (two) times daily.  Dispense: 4 tablet; Refill: 5 - CMP14+EGFR  6. Annual physical exam - CMP14+EGFR - Lipid panel - TSH - Anemia Profile B  7. Weight loss counseling, encounter for - tirzepatide (MOUNJARO) 2.5 MG/0.5ML Pen; Inject 2.5 mg into the skin once a week.  Dispense: 2 mL; Refill: 0 - tirzepatide (MOUNJARO) 5 MG/0.5ML Pen; Inject 5 mg into the skin once a week.  Dispense: 6 mL; Refill: 0 - CMP14+EGFR  8. Colon cancer screening - Cologuard - CMP14+EGFR  9. Fatigue, unspecified type - CMP14+EGFR - TSH - Anemia Profile B   Labs pending Health Maintenance reviewed Diet and exercise encouraged  Follow up plan: 2 months to recheck weight loss   [name], FNP

## 2020-11-03 NOTE — Patient Instructions (Signed)
Exercising to Lose Weight Exercise is structured, repetitive physical activity to improve fitness and health. Getting regular exercise is important for everyone. It is especially important if you are overweight. Being overweight increases your risk of heart disease, stroke, diabetes, high blood pressure, and several types of cancer.Reducing your calorie intake and exercising can help you lose weight. Exercise is usually categorized as moderate or vigorous intensity. To lose weight, most people need to do a certain amount of moderate-intensity orvigorous-intensity exercise each week. Moderate-intensity exercise  Moderate-intensity exercise is any activity that gets you moving enough to burn at least three times more energy (calories) than if you were sitting. Examples of moderate exercise include: Walking a mile in 15 minutes. Doing light yard work. Biking at an easy pace. Most people should get at least 150 minutes (2 hours and 30 minutes) a week ofmoderate-intensity exercise to maintain their body weight. Vigorous-intensity exercise Vigorous-intensity exercise is any activity that gets you moving enough to burn at least six times more calories than if you were sitting. When you exercise at this intensity, you should be working hard enough that you are not able tocarry on a conversation. Examples of vigorous exercise include: Running. Playing a team sport, such as football, basketball, and soccer. Jumping rope. Most people should get at least 75 minutes (1 hour and 15 minutes) a week ofvigorous-intensity exercise to maintain their body weight. How can exercise affect me? When you exercise enough to burn more calories than you eat, you lose weight. Exercise also reduces body fat and builds muscle. The more muscle you have, the more calories you burn. Exercise also: Improves mood. Reduces stress and tension. Improves your overall fitness, flexibility, and endurance. Increases bone strength. The  amount of exercise you need to lose weight depends on: Your age. The type of exercise. Any health conditions you have. Your overall physical ability. Talk to your health care provider about how much exercise you need and whattypes of activities are safe for you. What actions can I take to lose weight? Nutrition  Make changes to your diet as told by your health care provider or diet and nutrition specialist (dietitian). This may include: Eating fewer calories. Eating more protein. Eating less unhealthy fats. Eating a diet that includes fresh fruits and vegetables, whole grains, low-fat dairy products, and lean protein. Avoiding foods with added fat, salt, and sugar. Drink plenty of water while you exercise to prevent dehydration or heat stroke.  Activity Choose an activity that you enjoy and set realistic goals. Your health care provider can help you make an exercise plan that works for you. Exercise at a moderate or vigorous intensity most days of the week. The intensity of exercise may vary from person to person. You can tell how intense a workout is for you by paying attention to your breathing and heartbeat. Most people will notice their breathing and heartbeat get faster with more intense exercise. Do resistance training twice each week, such as: Push-ups. Sit-ups. Lifting weights. Using resistance bands. Getting short amounts of exercise can be just as helpful as long structured periods of exercise. If you have trouble finding time to exercise, try to include exercise in your daily routine. Get up, stretch, and walk around every 30 minutes throughout the day. Go for a walk during your lunch break. Park your car farther away from your destination. If you take public transportation, get off one stop early and walk the rest of the way. Make phone calls while standing up and  walking around. Take the stairs instead of elevators or escalators. Wear comfortable clothes and shoes with  good support. Do not exercise so much that you hurt yourself, feel dizzy, or get very short of breath. Where to find more information U.[place]. Department of Health and Human Services: [employer]at Centers for Disease Control and Prevention (CDC): http://www.wolf.info/ Contact a health care provider: Before starting a new exercise program. If you have questions or concerns about your weight. If you have a medical problem that keeps you from exercising. Get help right away if you have any of the following while exercising: Injury. Dizziness. Difficulty breathing or shortness of breath that does not go away when you stop exercising. Chest pain. Rapid heartbeat. Summary Being overweight increases your risk of heart disease, stroke, diabetes, high blood pressure, and several types of cancer. Losing weight happens when you burn more calories than you eat. Reducing the amount of calories you eat in addition to getting regular moderate or vigorous exercise each week helps you lose weight. This information is not intended to replace advice given to you by your health care provider. Make sure you discuss any questions you have with your healthcare provider. Document Revised: 07/07/2019 Document Reviewed: 07/24/2019 Elsevier Patient Education  2022 [employer].

## 2020-11-04 LAB — ANEMIA PROFILE B
Basophils Absolute: 0 10*3/uL (ref 0.0–0.2)
Basos: 1 %
EOS (ABSOLUTE): 0.2 10*3/uL (ref 0.0–0.4)
Eos: 3 %
Ferritin: 192 ng/mL — ABNORMAL HIGH (ref 15–150)
Folate: >20 ng/mL (ref >3.0)
Hematocrit: 42.8 % (ref 34.0–46.6)
Hemoglobin: 14.6 g/dL (ref 11.1–15.9)
Immature Grans (Abs): 0 10*3/uL (ref 0.0–0.1)
Immature Granulocytes: 0 %
Iron Saturation: 23 % (ref 15–55)
Iron: 78 ug/dL (ref 27–159)
Lymphocytes Absolute: 2.5 10*3/uL (ref 0.7–3.1)
Lymphs: 34 %
MCH: 30.4 pg (ref 26.6–33.0)
MCHC: 34.1 g/dL (ref 31.5–35.7)
MCV: 89 fL (ref 79–97)
Monocytes Absolute: 0.5 10*3/uL (ref 0.1–0.9)
Monocytes: 6 %
Neutrophils Absolute: 4.2 10*3/uL (ref 1.4–7.0)
Neutrophils: 56 %
Platelets: 290 10*3/uL (ref 150–450)
RBC: 4.81 x10E6/uL (ref 3.77–5.28)
RDW: 12.6 % (ref 11.7–15.4)
Retic Ct Pct: 1.5 % (ref 0.6–2.6)
Total Iron Binding Capacity: 337 ug/dL (ref 250–450)
UIBC: 259 ug/dL (ref 131–425)
Vitamin B-12: >2000 pg/mL — ABNORMAL HIGH (ref 232–1245)
WBC: 7.5 10*3/uL (ref 3.4–10.8)

## 2020-11-04 LAB — LIPID PANEL
Chol/HDL Ratio: 4.7 ratio — ABNORMAL HIGH (ref 0.0–4.4)
Cholesterol, Total: 182 mg/dL (ref 100–199)
HDL: 39 mg/dL — ABNORMAL LOW (ref >39)
LDL Chol Calc (NIH): 132 mg/dL — ABNORMAL HIGH (ref 0–99)
Triglycerides: 60 mg/dL (ref 0–149)
VLDL Cholesterol Cal: 11 mg/dL (ref 5–40)

## 2020-11-04 LAB — CMP14+EGFR
ALT: 26 IU/L (ref 0–32)
AST: 18 IU/L (ref 0–40)
Albumin/Globulin Ratio: 1.7 (ref 1.2–2.2)
Albumin: 4.3 g/dL (ref 3.8–4.8)
Alkaline Phosphatase: 94 IU/L (ref 44–121)
BUN/Creatinine Ratio: 15 (ref 9–23)
BUN: 12 mg/dL (ref 6–24)
Bilirubin Total: 0.4 mg/dL (ref 0.0–1.2)
CO2: 24 mmol/L (ref 20–29)
Calcium: 9.5 mg/dL (ref 8.7–10.2)
Chloride: 100 mmol/L (ref 96–106)
Creatinine, Ser: 0.78 mg/dL (ref 0.57–1.00)
Globulin, Total: 2.6 g/dL (ref 1.5–4.5)
Glucose: 100 mg/dL — ABNORMAL HIGH (ref 65–99)
Potassium: 3.9 mmol/L (ref 3.5–5.2)
Sodium: 139 mmol/L (ref 134–144)
Total Protein: 6.9 g/dL (ref 6.0–8.5)
eGFR: 95 mL/min/{1.73_m2} (ref >59)

## 2020-11-04 LAB — TSH: TSH: 0.871 u[IU]/mL (ref 0.450–4.500)

## 2020-11-11 ENCOUNTER — Telehealth: Payer: Self-pay | Admitting: Pharmacist

## 2020-11-11 NOTE — Telephone Encounter (Signed)
Called [facility] mounjaro $25 now Run [place] on copay card do not run insurance Copay card left up front for patient to pick up Left VM with patient

## 2020-12-01 LAB — COLOGUARD: Cologuard: NEGATIVE

## 2020-12-06 ENCOUNTER — Other Ambulatory Visit: Payer: BC Managed Care – PPO

## 2020-12-16 ENCOUNTER — Ambulatory Visit
Admission: RE | Admit: 2020-12-16 | Discharge: 2020-12-16 | Disposition: A | Discharge status: Home / Self Care | Payer: BC Managed Care – PPO | Source: Ambulatory Visit | Attending: Family | Admitting: Family

## 2020-12-16 ENCOUNTER — Other Ambulatory Visit: Payer: Self-pay

## 2020-12-16 DIAGNOSIS — R928 Other abnormal and inconclusive findings on diagnostic imaging of breast: Secondary | ICD-10-CM

## 2021-01-04 ENCOUNTER — Ambulatory Visit: Payer: BC Managed Care – PPO | Admitting: Family

## 2021-01-06 ENCOUNTER — Ambulatory Visit (INDEPENDENT_AMBULATORY_CARE_PROVIDER_SITE_OTHER): Payer: BC Managed Care – PPO | Admitting: Family

## 2021-01-06 ENCOUNTER — Other Ambulatory Visit: Payer: Self-pay

## 2021-01-06 ENCOUNTER — Encounter: Payer: Self-pay | Admitting: Family

## 2021-01-06 VITALS — BP 120/79 | HR 77 | Temp 97.8°F | Ht 60.0 in | Wt 202.4 lb

## 2021-01-06 DIAGNOSIS — E669 Obesity, unspecified: Secondary | ICD-10-CM | POA: Diagnosis not present

## 2021-01-06 DIAGNOSIS — Z713 Dietary counseling and surveillance: Secondary | ICD-10-CM | POA: Diagnosis not present

## 2021-01-06 MED ORDER — TIRZEPATIDE 7.5 MG/0.5ML ~~LOC~~ SOAJ: 7.5000 mg | SUBCUTANEOUS | 0 refills | Status: DC

## 2021-01-06 MED ORDER — TIRZEPATIDE 10 MG/0.5ML ~~LOC~~ SOAJ: 10.0000 mg | SUBCUTANEOUS | 0 refills | Status: DC

## 2021-01-06 NOTE — Patient Instructions (Signed)

## 2021-01-06 NOTE — Progress Notes (Signed)
   Subjective:    Patient ID: Lauren [name], female    DOB: [birth year], 45 y.o.   MRN: [id number]  Chief Complaint  Patient presents with   Follow-up    2 months to recheck weight loss    HPI Pt presents to the office today to recheck weight loss. She was started on Mounjaro 2.5 mg for one month then increased to 5 mg. She has lost 10 lbs. Denies any nausea and vomiting.    Review of Systems  All other systems reviewed and are negative.     Objective:   Physical Exam Vitals reviewed.  Constitutional:      General: She is not in acute distress.    Appearance: She is well-developed. She is obese.  HENT:     Head: Normocephalic and atraumatic.  Eyes:     Pupils: Pupils are equal, round, and reactive to light.  Neck:     Thyroid: No thyromegaly.  Cardiovascular:     Rate and Rhythm: Normal rate and regular rhythm.     Heart sounds: Normal heart sounds. No murmur heard. Pulmonary:     Effort: Pulmonary effort is normal. No respiratory distress.     Breath sounds: Normal breath sounds. No wheezing.  Abdominal:     General: Bowel sounds are normal. There is no distension.     Palpations: Abdomen is soft.     Tenderness: There is no abdominal tenderness.  Musculoskeletal:        General: No tenderness. Normal range of motion.     Cervical back: Normal range of motion and neck supple.  Skin:    General: Skin is warm and dry.  Neurological:     Mental Status: She is alert and oriented to person, place, and time.     Cranial Nerves: No cranial nerve deficit.     Deep Tendon Reflexes: Reflexes are normal and symmetric.  Psychiatric:        Behavior: Behavior normal.        Thought Content: Thought content normal.        Judgment: Judgment normal.      BP 120/79   Pulse 77   Temp 97.8 F (36.6 C) (Temporal)   Ht 5' (1.524 m)   Wt 202 lb 6.4 oz (91.8 kg)   LMP 08/18/2012   BMI 39.53 kg/m      Assessment & Plan:  Lauren [name] comes in today with chief  complaint of Follow-up (2 months to recheck weight loss)   Diagnosis and orders addressed:  1. Weight loss counseling, encounter for - tirzepatide (MOUNJARO) 7.5 MG/0.5ML Pen; Inject 7.5 mg into the skin once a week.  Dispense: 6 mL; Refill: 0 - tirzepatide (MOUNJARO) 10 MG/0.5ML Pen; Inject 10 mg into the skin once a week.  Dispense: 6 mL; Refill: 0  2. Obesity (BMI 30-39.9) - tirzepatide (MOUNJARO) 7.5 MG/0.5ML Pen; Inject 7.5 mg into the skin once a week.  Dispense: 6 mL; Refill: 0 - tirzepatide (MOUNJARO) 10 MG/0.5ML Pen; Inject 10 mg into the skin once a week.  Dispense: 6 mL; Refill: 0   Increase Mounjaro to 7.5 mg for one month then increase to 10 mg for month.  Encourage exercise Healthy diet RTO 2 months    [name], [place]

## 2021-01-10 ENCOUNTER — Ambulatory Visit: Payer: BC Managed Care – PPO | Admitting: Family

## 2021-03-08 ENCOUNTER — Other Ambulatory Visit: Payer: Self-pay

## 2021-03-08 ENCOUNTER — Ambulatory Visit: Payer: BC Managed Care – PPO | Admitting: Family

## 2021-03-08 ENCOUNTER — Encounter: Payer: Self-pay | Admitting: Family

## 2021-03-08 VITALS — BP 128/88 | HR 82 | Temp 97.9°F | Ht 60.0 in | Wt 199.0 lb

## 2021-03-08 DIAGNOSIS — Z713 Dietary counseling and surveillance: Secondary | ICD-10-CM

## 2021-03-08 DIAGNOSIS — E669 Obesity, unspecified: Secondary | ICD-10-CM

## 2021-03-08 DIAGNOSIS — E785 Hyperlipidemia, unspecified: Secondary | ICD-10-CM | POA: Diagnosis not present

## 2021-03-08 DIAGNOSIS — I10 Essential (primary) hypertension: Secondary | ICD-10-CM

## 2021-03-08 DIAGNOSIS — J4521 Mild intermittent asthma with (acute) exacerbation: Secondary | ICD-10-CM

## 2021-03-08 LAB — CMP14+EGFR
ALT: 27 IU/L (ref 0–32)
AST: 21 IU/L (ref 0–40)
Albumin/Globulin Ratio: 1.5 (ref 1.2–2.2)
Albumin: 4 g/dL (ref 3.8–4.8)
Alkaline Phosphatase: 101 IU/L (ref 44–121)
BUN/Creatinine Ratio: 13 (ref 9–23)
BUN: 10 mg/dL (ref 6–24)
Bilirubin Total: 0.3 mg/dL (ref 0.0–1.2)
CO2: 25 mmol/L (ref 20–29)
Calcium: 9.6 mg/dL (ref 8.7–10.2)
Chloride: 98 mmol/L (ref 96–106)
Creatinine, Ser: 0.75 mg/dL (ref 0.57–1.00)
Globulin, Total: 2.6 g/dL (ref 1.5–4.5)
Glucose: 81 mg/dL (ref 70–99)
Potassium: 4.1 mmol/L (ref 3.5–5.2)
Sodium: 135 mmol/L (ref 134–144)
Total Protein: 6.6 g/dL (ref 6.0–8.5)
eGFR: 100 mL/min/{1.73_m2} (ref >59)

## 2021-03-08 MED ORDER — HYDROCHLOROTHIAZIDE 12.5 MG PO CAPS: 12.5000 mg | ORAL_CAPSULE | Freq: Every day | ORAL | 1 refills | Status: DC

## 2021-03-08 MED ORDER — TIRZEPATIDE 10 MG/0.5ML ~~LOC~~ SOAJ: 10.0000 mg | SUBCUTANEOUS | 2 refills | Status: DC

## 2021-03-08 NOTE — Patient Instructions (Signed)
Alopecia Areata, Adult ?Alopecia areata is a condition that causes hair loss. A person with this condition may lose hair on the scalp in patches. In some cases, a person may lose all the hair on the scalp or all the hair from the face and body. Having this condition can be emotionally difficult, but it is not dangerous. ?Alopecia areata is an autoimmune disease. This means that your body's defense system (immune system) mistakes normal parts of the body for germs or other things that can make you sick. When you have alopecia areata, the immune system attacks the hair follicles. ?What are the causes? ?The cause of this condition is not known. ?What increases the risk? ?You are more likely to develop this condition if you have: ?A family history of alopecia. ?A family history of another autoimmune disease, including type 1 diabetes and thyroid autoimmune disease. ?Eczema, asthma, and allergies. ?Down syndrome. ?What are the signs or symptoms? ?The main symptom of this condition is round spots of patchy hair loss on the scalp. The spots may be mildly itchy. Other symptoms include: ?Short dark hairs in the bald patches that are wider at the top (exclamation point hairs). ?Dents, white spots, or lines in the fingernails or toenails. ?Balding and body hair loss. This is rare. ?Alopecia areata usually develops in childhood, but it can develop at any age. For some people, their hair grows back on its own and hair loss does not happen again. For others, their hair may fall out and grow back in cycles. The hair loss may last many years. ?How is this diagnosed? ?This condition is diagnosed based on your symptoms and family history. Your health care provider will also check your scalp skin, teeth, and nails. Your health care provider may refer you to a specialist in hair and skin disorders (dermatologist). ?You may also have tests, including: ?A hair pull test. ?Blood tests or other screening tests to check for autoimmune  diseases, such as thyroid disease or diabetes. ?Skin biopsy to confirm the diagnosis. ?A procedure to examine the skin with a lighted magnifying instrument (dermoscopy). ?How is this treated? ?There is no cure for alopecia areata. The goals of treatment are to promote the regrowth of hair and prevent the immune system from overreacting. No single treatment is right for all people with alopecia areata. It depends on the type of hair loss you have and how severe it is. ?Work with your health care provider to find the best treatment for you. Treatment may include: ?Regular checkups to make sure the condition is not getting worse . This is called watchful waiting. ?Using steroid creams or pills for 6-8 weeks to stop the immune reaction and help hair to regrow more quickly. ?Using other medicines on your skin (topical medicines) to change the immune system response and support the hair growth cycle. ?Steroid injections. ?Therapy and counseling with a support group or therapist if you are having trouble coping with hair loss. ?Follow these instructions at home: ?Medicines ?Apply topical creams only as told by your health care provider. ?Take over-the-counter and prescription medicines only as told by your health care provider. ?General instructions ?Learn as much as you can about your condition. ?Consider getting a wig or products to make hair look fuller or to cover bald spots, if you feel uncomfortable with your appearance. ?Get therapy or counseling if you are having a hard time coping with hair loss. Ask your health care provider to recommend a counselor or support group. ?Keep all   follow-up visits as told by your health care provider. This is important. ?Where to find more information ?National Alopecia Areata Foundation: naaf.org ?Contact a health care provider if: ?Your hair loss gets worse, even with treatment. ?You have new symptoms. ?You are struggling emotionally. ?Get help right away if: ?You have a sudden  worsening of the hair loss. ?Summary ?Alopecia areata is an autoimmune condition that makes your body's defense system (immune system) attack the hair follicles. This causes you to lose hair. ?Having this condition can be emotionally difficult, but it is not dangerous. ?Treatments may include regular checkups to make sure that the condition is not getting worse, medicines, and steroid injections. ?This information is not intended to replace advice given to you by your health care provider. Make sure you discuss any questions you have with your health care provider. ?Document Revised: 06/10/2019 Document Reviewed: 06/10/2019 ?Elsevier Patient Education ? 2022 Elsevier Inc. ? ?

## 2021-03-08 NOTE — Progress Notes (Addendum)
Subjective:    Patient ID: Lauren [name], female    DOB: Sep 06, 1975, 45 y.o.   MRN: [id number]  Chief Complaint  Patient presents with   Medical Management of Chronic Issues   Pt presents to the office today discuss weight loss. She is currently taking Mounjaro 10 mg. She has lost 3 lbs since our last visit. She reports last week prior to Thanksgiving she was 4 lb extra down. Denies any nausea, vomiting, diarrhea, or constipation.  Hypertension This is a chronic problem. The current episode started more than 1 year ago. The problem has been resolved since onset. The problem is controlled. Pertinent negatives include no malaise/fatigue, peripheral edema or shortness of breath. Risk factors for coronary artery disease include obesity. Past treatments include diuretics. The current treatment provides moderate improvement.  Hyperlipidemia This is a chronic problem. The current episode started more than 1 year ago. Exacerbating diseases include obesity. Pertinent negatives include no shortness of breath. Current antihyperlipidemic treatment includes statins. The current treatment provides moderate improvement of lipids. Risk factors for coronary artery disease include dyslipidemia, hypertension and a sedentary lifestyle.  Asthma There is no cough, shortness of breath or wheezing. This is a chronic problem. The current episode started more than 1 year ago. The problem occurs intermittently. Pertinent negatives include no malaise/fatigue. She reports moderate improvement on treatment. Her past medical history is significant for asthma.     Review of Systems  Constitutional:  Negative for malaise/fatigue.  Respiratory:  Negative for cough, shortness of breath and wheezing.   All other systems reviewed and are negative.     Objective:   Physical Exam Vitals reviewed.  Constitutional:      General: She is not in acute distress.    Appearance: She is well-developed. She is obese.  HENT:      Head: Normocephalic and atraumatic.     Right Ear: Tympanic membrane normal.     Left Ear: Tympanic membrane normal.  Eyes:     Pupils: Pupils are equal, round, and reactive to light.  Neck:     Thyroid: No thyromegaly.  Cardiovascular:     Rate and Rhythm: Normal rate and regular rhythm.     Heart sounds: Normal heart sounds. No murmur heard. Pulmonary:     Effort: Pulmonary effort is normal. No respiratory distress.     Breath sounds: Normal breath sounds. No wheezing.  Abdominal:     General: Bowel sounds are normal. There is no distension.     Palpations: Abdomen is soft.     Tenderness: There is no abdominal tenderness.  Musculoskeletal:        General: No tenderness. Normal range of motion.     Cervical back: Normal range of motion and neck supple.  Skin:    General: Skin is warm and dry.  Neurological:     Mental Status: She is alert and oriented to person, place, and time.     Cranial Nerves: No cranial nerve deficit.     Deep Tendon Reflexes: Reflexes are normal and symmetric.  Psychiatric:        Behavior: Behavior normal.        Thought Content: Thought content normal.        Judgment: Judgment normal.      BP 128/88 Comment: patient reports at home  Pulse 82   Temp 97.9 F (36.6 C) (Temporal)   Ht 5' (1.524 m)   Wt 199 lb (90.3 kg)   LMP 08/18/2012  BMI 38.86 kg/m      Assessment & Plan:  Lauren [name] comes in today with chief complaint of Medical Management of Chronic Issues   Diagnosis and orders addressed:  1. Primary hypertension - hydrochlorothiazide (MICROZIDE) 12.5 MG capsule; Take 1 capsule (12.5 mg total) by mouth daily.  Dispense: 90 capsule; Refill: 1 - CMP14+EGFR  2. Weight loss counseling, encounter for Will continue Mounjaro 10 mg  Encourage exercise and healthy diet - tirzepatide (MOUNJARO) 10 MG/0.5ML Pen; Inject 10 mg into the skin once a week.  Dispense: 6 mL; Refill: 2 - CMP14+EGFR  3. Obesity (BMI 30-39.9) - tirzepatide  (MOUNJARO) 10 MG/0.5ML Pen; Inject 10 mg into the skin once a week.  Dispense: 6 mL; Refill: 2  4. Hyperlipidemia, unspecified hyperlipidemia type  - CMP14+EGFR  5. Mild intermittent asthma with acute exacerbation - CMP14+EGFR   Labs pending Health Maintenance reviewed Diet and exercise encouraged  Follow up plan: 3 months   [name], FNP

## 2021-04-14 ENCOUNTER — Other Ambulatory Visit: Payer: Self-pay | Admitting: Family

## 2021-04-14 DIAGNOSIS — E559 Vitamin D deficiency, unspecified: Secondary | ICD-10-CM

## 2021-05-15 ENCOUNTER — Other Ambulatory Visit: Payer: Self-pay | Admitting: Family

## 2021-05-15 DIAGNOSIS — E669 Obesity, unspecified: Secondary | ICD-10-CM

## 2021-05-15 DIAGNOSIS — Z713 Dietary counseling and surveillance: Secondary | ICD-10-CM

## 2021-06-02 ENCOUNTER — Encounter: Payer: Self-pay | Admitting: Family

## 2021-06-02 ENCOUNTER — Ambulatory Visit (INDEPENDENT_AMBULATORY_CARE_PROVIDER_SITE_OTHER): Payer: BC Managed Care – PPO | Admitting: Family

## 2021-06-02 VITALS — BP 121/86 | HR 85 | Temp 97.9°F | Ht 60.0 in | Wt 183.6 lb

## 2021-06-02 DIAGNOSIS — E785 Hyperlipidemia, unspecified: Secondary | ICD-10-CM

## 2021-06-02 DIAGNOSIS — I159 Secondary hypertension, unspecified: Secondary | ICD-10-CM

## 2021-06-02 DIAGNOSIS — Z713 Dietary counseling and surveillance: Secondary | ICD-10-CM | POA: Diagnosis not present

## 2021-06-02 DIAGNOSIS — L089 Local infection of the skin and subcutaneous tissue, unspecified: Secondary | ICD-10-CM

## 2021-06-02 DIAGNOSIS — I10 Essential (primary) hypertension: Secondary | ICD-10-CM | POA: Insufficient documentation

## 2021-06-02 DIAGNOSIS — E669 Obesity, unspecified: Secondary | ICD-10-CM

## 2021-06-02 MED ORDER — DOXYCYCLINE HYCLATE 100 MG PO TABS: 100.0000 mg | ORAL_TABLET | Freq: Two times a day (BID) | ORAL | 0 refills | Status: DC

## 2021-06-02 MED ORDER — OZEMPIC (0.25 OR 0.5 MG/DOSE) 2 MG/1.5ML ~~LOC~~ SOPN: 1.5000 mg | PEN_INJECTOR | SUBCUTANEOUS | 1 refills | Status: DC

## 2021-06-02 NOTE — Patient Instructions (Signed)
Calorie Counting for Weight Loss Calories are units of energy. Your body needs a certain number of calories from food to keep going throughout the day. When you eat or drink more calories than your body needs, your body stores the extra calories mostly as fat. When you eat or drink fewer calories than your body needs, your body burns fat to get the energy it needs. Calorie counting means keeping track of how many calories you eat and drink each day. Calorie counting can be helpful if you need to lose weight. If you eat fewer calories than your body needs, you should lose weight. Ask your health care provider what a healthy weight is for you. For calorie counting to work, you will need to eat the right number of calories each day to lose a healthy amount of weight per week. A dietitian can help you figure out how many calories you need in a day and will suggest ways to reach your calorie goal. A healthy amount of weight to lose each week is usually 1-2 lb (0.5-0.9 kg). This usually means that your daily calorie intake should be reduced by 500-750 calories. Eating 1,200-1,500 calories a day can help most women lose weight. Eating 1,500-1,800 calories a day can help most men lose weight. What do I need to know about calorie counting? Work with your health care provider or dietitian to determine how many calories you should get each day. To meet your daily calorie goal, you will need to: Find out how many calories are in each food that you would like to eat. Try to do this before you eat. Decide how much of the food you plan to eat. Keep a food log. Do this by writing down what you ate and how many calories it had. To successfully lose weight, it is important to balance calorie counting with a healthy lifestyle that includes regular activity. Where do I find calorie information? The number of calories in a food can be found on a Nutrition Facts label. If a food does not have a Nutrition Facts label, try to  look up the calories online or ask your dietitian for help. Remember that calories are listed per serving. If you choose to have more than one serving of a food, you will have to multiply the calories per serving by the number of servings you plan to eat. For example, the label on a package of bread might say that a serving size is 1 slice and that there are 90 calories in a serving. If you eat 1 slice, you will have eaten 90 calories. If you eat 2 slices, you will have eaten 180 calories. How do I keep a food log? After each time that you eat, record the following in your food log as soon as possible: What you ate. Be sure to include toppings, sauces, and other extras on the food. How much you ate. This can be measured in cups, ounces, or number of items. How many calories were in each food and drink. The total number of calories in the food you ate. Keep your food log near you, such as in a pocket-sized notebook or on an app or website on your mobile phone. Some programs will calculate calories for you and show you how many calories you have left to meet your daily goal. What are some portion-control tips? Know how many calories are in a serving. This will help you know how many servings you can have of a certain food.  Use a measuring cup to measure serving sizes. You could also try weighing out portions on a kitchen scale. With time, you will be able to estimate serving sizes for some foods. Take time to put servings of different foods on your favorite plates or in your favorite bowls and cups so you know what a serving looks like. Try not to eat straight from a food's packaging, such as from a bag or box. Eating straight from the package makes it hard to see how much you are eating and can lead to overeating. Put the amount you would like to eat in a cup or on a plate to make sure you are eating the right portion. Use smaller plates, glasses, and bowls for smaller portions and to prevent  overeating. Try not to multitask. For example, avoid watching TV or using your computer while eating. If it is time to eat, sit down at a table and enjoy your food. This will help you recognize when you are full. It will also help you be more mindful of what and how much you are eating. What are tips for following this plan? Reading food labels Check the calorie count compared with the serving size. The serving size may be smaller than what you are used to eating. Check the source of the calories. Try to choose foods that are high in protein, fiber, and vitamins, and low in saturated fat, trans fat, and sodium. Shopping Read nutrition labels while you shop. This will help you make healthy decisions about which foods to buy. Pay attention to nutrition labels for low-fat or fat-free foods. These foods sometimes have the same number of calories or more calories than the full-fat versions. They also often have added sugar, starch, or salt to make up for flavor that was removed with the fat. Make a grocery list of lower-calorie foods and stick to it. Cooking Try to cook your favorite foods in a healthier way. For example, try baking instead of frying. Use low-fat dairy products. Meal planning Use more fruits and vegetables. One-half of your plate should be fruits and vegetables. Include lean proteins, such as chicken, [place], and fish. Lifestyle Each week, aim to do one of the following: 150 minutes of moderate exercise, such as walking. 75 minutes of vigorous exercise, such as running. General information Know how many calories are in the foods you eat most often. This will help you calculate calorie counts faster. Find a way of tracking calories that works for you. Get creative. Try different apps or programs if writing down calories does not work for you. What foods should I eat?  Eat nutritious foods. It is better to have a nutritious, high-calorie food, such as an avocado, than a food with  few nutrients, such as a bag of potato chips. Use your calories on foods and drinks that will fill you up and will not leave you hungry soon after eating. Examples of foods that fill you up are nuts and nut butters, vegetables, lean proteins, and high-fiber foods such as whole grains. High-fiber foods are foods with more than 5 g of fiber per serving. Pay attention to calories in drinks. Low-calorie drinks include water and unsweetened drinks. The items listed above may not be a complete list of foods and beverages you can eat. Contact a dietitian for more information. What foods should I limit? Limit foods or drinks that are not good sources of vitamins, minerals, or protein or that are high in unhealthy fats. These include:  Candy. Other sweets. Sodas, specialty coffee drinks, alcohol, and juice. The items listed above may not be a complete list of foods and beverages you should avoid. Contact a dietitian for more information. How do I count calories when eating out? Pay attention to portions. Often, portions are much larger when eating out. Try these tips to keep portions smaller: Consider sharing a meal instead of getting your own. If you get your own meal, eat only half of it. Before you start eating, ask for a container and put half of your meal into it. When available, consider ordering smaller portions from the menu instead of full portions. Pay attention to your food and drink choices. Knowing the way food is cooked and what is included with the meal can help you eat fewer calories. If calories are listed on the menu, choose the lower-calorie options. Choose dishes that include vegetables, fruits, whole grains, low-fat dairy products, and lean proteins. Choose items that are boiled, broiled, grilled, or steamed. Avoid items that are buttered, battered, fried, or served with cream sauce. Items labeled as crispy are usually fried, unless stated otherwise. Choose water, low-fat milk,  unsweetened iced tea, or other drinks without added sugar. If you want an alcoholic beverage, choose a lower-calorie option, such as a glass of wine or light beer. Ask for dressings, sauces, and syrups on the side. These are usually high in calories, so you should limit the amount you eat. If you want a salad, choose a garden salad and ask for grilled meats. Avoid extra toppings such as bacon, cheese, or fried items. Ask for the dressing on the side, or ask for olive oil and vinegar or lemon to use as dressing. Estimate how many servings of a food you are given. Knowing serving sizes will help you be aware of how much food you are eating at restaurants. Where to find more information Centers for Disease Control and Prevention: http://www.wolf.info/ [place] Department of Agriculture: http://www.wilson-mendoza.org/ Summary Calorie counting means keeping track of how many calories you eat and drink each day. If you eat fewer calories than your body needs, you should lose weight. A healthy amount of weight to lose per week is usually 1-2 lb (0.5-0.9 kg). This usually means reducing your daily calorie intake by 500-750 calories. The number of calories in a food can be found on a Nutrition Facts label. If a food does not have a Nutrition Facts label, try to look up the calories online or ask your dietitian for help. Use smaller plates, glasses, and bowls for smaller portions and to prevent overeating. Use your calories on foods and drinks that will fill you up and not leave you hungry shortly after a meal. This information is not intended to replace advice given to you by your health care provider. Make sure you discuss any questions you have with your health care provider. Document Revised: 05/08/2019 Document Reviewed: 05/08/2019 Elsevier Patient Education  2022 [employer].

## 2021-06-02 NOTE — Progress Notes (Signed)
Subjective:    Patient ID: Lauren [name], female    DOB: 06-Jul-1975, 46 y.o.   MRN: [id number]  Chief Complaint  Patient presents with   Medical Management of Chronic Issues   Pt presents to the office today discuss weight loss. She is currently taking Ozempic 1 mg. She was on Mounjaro 10 mg, but this was switched to Ozmepic per her insurance.   Since starting on injectable medications she is down 31 lbs. She is not doing any scheduled exercise, but works 68 hours a week and doing 9,000-11,000 steps a day at work.   She is also complaining of fourth toe on her right toe that is swollen and tender. She report two weeks ago it was draining a purulent discharge.  Hyperlipidemia This is a chronic problem. The current episode started more than 1 year ago. Exacerbating diseases include obesity. Pertinent negatives include no shortness of breath. Current antihyperlipidemic treatment includes diet change. The current treatment provides mild improvement of lipids. Risk factors for coronary artery disease include dyslipidemia, hypertension and a sedentary lifestyle.  Hypertension This is a chronic problem. The current episode started more than 1 year ago. The problem has been waxing and waning since onset. Associated symptoms include malaise/fatigue. Pertinent negatives include no peripheral edema or shortness of breath. Risk factors for coronary artery disease include dyslipidemia and obesity. Past treatments include diuretics. The current treatment provides moderate improvement.     Review of Systems  Constitutional:  Positive for malaise/fatigue.  Respiratory:  Negative for shortness of breath.   All other systems reviewed and are negative.     Objective:   Physical Exam Vitals reviewed.  Constitutional:      General: She is not in acute distress.    Appearance: She is well-developed. She is obese.  HENT:     Head: Normocephalic and atraumatic.     Right Ear: Tympanic membrane normal.      Left Ear: Tympanic membrane normal.  Eyes:     Pupils: Pupils are equal, round, and reactive to light.  Neck:     Thyroid: No thyromegaly.  Cardiovascular:     Rate and Rhythm: Normal rate and regular rhythm.     Heart sounds: Normal heart sounds. No murmur heard. Pulmonary:     Effort: Pulmonary effort is normal. No respiratory distress.     Breath sounds: Normal breath sounds. No wheezing.  Abdominal:     General: Bowel sounds are normal. There is no distension.     Palpations: Abdomen is soft.     Tenderness: There is no abdominal tenderness.  Musculoskeletal:        General: No tenderness. Normal range of motion.     Cervical back: Normal range of motion and neck supple.  Skin:    General: Skin is warm and dry.  Neurological:     Mental Status: She is alert and oriented to person, place, and time.     Cranial Nerves: No cranial nerve deficit.     Deep Tendon Reflexes: Reflexes are normal and symmetric.  Psychiatric:        Behavior: Behavior normal.        Thought Content: Thought content normal.        Judgment: Judgment normal.      BP 121/86    Pulse 85    Temp 97.9 F (36.6 C) (Temporal)    Ht 5' (1.524 m)    Wt 183 lb 9.6 oz (83.3 kg)  LMP 08/18/2012    BMI 35.86 kg/m      Assessment & Plan:  Lauren [name] comes in today with chief complaint of Medical Management of Chronic Issues   Diagnosis and orders addressed:  1. Hyperlipidemia, unspecified hyperlipidemia type - Semaglutide,0.25 or 0.5MG/DOS, (OZEMPIC, 0.25 OR 0.5 MG/DOSE,) 2 MG/1.5ML SOPN; Inject 1.5 mg into the skin once a week.  Dispense: 3 mL; Refill: 1  2. Obesity (BMI 30-39.9) - Semaglutide,0.25 or 0.5MG/DOS, (OZEMPIC, 0.25 OR 0.5 MG/DOSE,) 2 MG/1.5ML SOPN; Inject 1.5 mg into the skin once a week.  Dispense: 3 mL; Refill: 1  3. Secondary hypertension - Semaglutide,0.25 or 0.5MG/DOS, (OZEMPIC, 0.25 OR 0.5 MG/DOSE,) 2 MG/1.5ML SOPN; Inject 1.5 mg into the skin once a week.  Dispense: 3 mL;  Refill: 1  4. Weight loss counseling, encounter for - Semaglutide,0.25 or 0.5MG/DOS, (OZEMPIC, 0.25 OR 0.5 MG/DOSE,) 2 MG/1.5ML SOPN; Inject 1.5 mg into the skin once a week.  Dispense: 3 mL; Refill: 1  5. Toe infection Start doxycyline 100 mg BID - doxycycline (VIBRA-TABS) 100 MG tablet; Take 1 tablet (100 mg total) by mouth 2 (two) times daily.  Dispense: 20 tablet; Refill: 0   Will increase Ozempic to 1.5 mg from 1 mg  Encourage exercise Healthy diet Health Maintenance reviewed Diet and exercise encouraged  Follow up plan: 3 months   [name], FNP

## 2021-06-16 ENCOUNTER — Ambulatory Visit: Payer: BC Managed Care – PPO | Admitting: Physician Assistant

## 2021-06-16 ENCOUNTER — Other Ambulatory Visit: Payer: Self-pay | Admitting: Physician Assistant

## 2021-06-16 ENCOUNTER — Encounter: Payer: Self-pay | Admitting: Physician Assistant

## 2021-06-16 ENCOUNTER — Other Ambulatory Visit: Payer: Self-pay

## 2021-06-16 DIAGNOSIS — D2362 Other benign neoplasm of skin of left upper limb, including shoulder: Secondary | ICD-10-CM | POA: Diagnosis not present

## 2021-06-16 DIAGNOSIS — Z1283 Encounter for screening for malignant neoplasm of skin: Secondary | ICD-10-CM

## 2021-06-16 DIAGNOSIS — L659 Nonscarring hair loss, unspecified: Secondary | ICD-10-CM | POA: Diagnosis not present

## 2021-06-16 DIAGNOSIS — D2361 Other benign neoplasm of skin of right upper limb, including shoulder: Secondary | ICD-10-CM | POA: Diagnosis not present

## 2021-06-16 DIAGNOSIS — B001 Herpesviral vesicular dermatitis: Secondary | ICD-10-CM

## 2021-06-16 DIAGNOSIS — D239 Other benign neoplasm of skin, unspecified: Secondary | ICD-10-CM

## 2021-06-16 MED ORDER — VALACYCLOVIR HCL 1 G PO TABS: 1000.0000 mg | ORAL_TABLET | Freq: Two times a day (BID) | ORAL | 6 refills | Status: DC

## 2021-06-16 MED ORDER — BETAMETHASONE DIPROPIONATE 0.05 % EX OINT: TOPICAL_OINTMENT | Freq: Two times a day (BID) | CUTANEOUS | 6 refills | Status: DC | PRN

## 2021-06-16 NOTE — Progress Notes (Signed)
? ?  New Patient ?  ?Subjective  ?Lauren [name] is a 46 y.o. female who presents for the following: Annual Exam (Hair shedding due to job change self resolved, patient wants refill on valtrex for fever blisters. Possible isk or Df on the left thigh and right shoulder.). ? ? ?The following portions of the chart were reviewed this encounter and updated as appropriate:  Tobacco  Allergies  Meds  Problems  Med Hx  Surg Hx  Fam Hx   ?  ? ?Objective  ?Well appearing patient in no apparent distress; mood and affect are within normal limits. ? ?A full examination was performed including scalp, head, eyes, ears, nose, lips, neck, chest, axillae, abdomen, back, buttocks, bilateral upper extremities, bilateral lower extremities, hands, feet, fingers, toes, fingernails, and toenails. All findings within normal limits unless otherwise noted below. ? ?Left Lower Leg - Anterior, Right Shoulder - Posterior ?Dark nodule ? ?Mid Frontal Scalp ?No bald patches today.  ? ?Mid Lower [place] Lip ?Clear today ? ? ?Assessment & Plan  ?Dermatofibroma (2) ?Right Shoulder - Posterior; Left Lower Leg - Anterior ? ?Okay to leave if stable ? ?betamethasone dipropionate (DIPROLENE) 0.05 % ointment - Left Lower Leg - Anterior, Right Shoulder - Posterior ?Apply topically 2 (two) times daily as needed (Rash). ? ?Alopecia ?Mid Frontal Scalp ? ?betamethasone dipropionate (DIPROLENE) 0.05 % ointment - Mid Frontal Scalp ?Apply topically 2 (two) times daily as needed (Rash). ? ?Recurrent cold sores ?Mid Lower [place] Lip ? ?valACYclovir (VALTREX) 1000 MG tablet - Mid Lower Vermilion Lip ?Take 1 tablet (1,000 mg total) by mouth 2 (two) times daily. ? ? ? ? ?I, Sharron Simpson, PA-C, have reviewed all documentation's for this visit.  The documentation on 06/16/21 for the exam, diagnosis, procedures and orders are all accurate and complete. ?

## 2021-07-11 ENCOUNTER — Other Ambulatory Visit: Payer: Self-pay | Admitting: Family

## 2021-07-11 DIAGNOSIS — E559 Vitamin D deficiency, unspecified: Secondary | ICD-10-CM

## 2021-08-09 ENCOUNTER — Telehealth: Payer: Self-pay | Admitting: Physician Assistant

## 2021-08-09 ENCOUNTER — Other Ambulatory Visit: Payer: Self-pay | Admitting: Family

## 2021-08-09 DIAGNOSIS — Z713 Dietary counseling and surveillance: Secondary | ICD-10-CM

## 2021-08-09 DIAGNOSIS — L089 Local infection of the skin and subcutaneous tissue, unspecified: Secondary | ICD-10-CM

## 2021-08-09 DIAGNOSIS — E669 Obesity, unspecified: Secondary | ICD-10-CM

## 2021-08-10 ENCOUNTER — Other Ambulatory Visit: Payer: Self-pay | Admitting: Family

## 2021-08-10 DIAGNOSIS — E669 Obesity, unspecified: Secondary | ICD-10-CM

## 2021-08-10 DIAGNOSIS — Z713 Dietary counseling and surveillance: Secondary | ICD-10-CM

## 2021-08-11 NOTE — Telephone Encounter (Signed)
OZEMPIC, 1 MG/DOSE, 4 MG/3ML SOPN  ?    ? Changed from: Semaglutide, 1 MG/DOSE, (OZEMPIC, 1 MG/DOSE,) 2 MG/1.5ML SOPN  ?Pharmacy comment: Script Clarification:PLEASE RESEND AS 2 MG PER DOSE PEN PER 3 ML... INSURANCE WILL NOT ALLOW PATIENT TO USE MUTLIPLE DOSES PER WEEK. ?

## 2021-09-02 ENCOUNTER — Encounter: Payer: Self-pay | Admitting: Family

## 2021-09-02 ENCOUNTER — Ambulatory Visit (INDEPENDENT_AMBULATORY_CARE_PROVIDER_SITE_OTHER): Payer: BC Managed Care – PPO | Admitting: Family

## 2021-09-02 VITALS — BP 137/84 | HR 76 | Temp 97.6°F | Ht 60.0 in | Wt 183.2 lb

## 2021-09-02 DIAGNOSIS — I10 Essential (primary) hypertension: Secondary | ICD-10-CM | POA: Diagnosis not present

## 2021-09-02 DIAGNOSIS — E669 Obesity, unspecified: Secondary | ICD-10-CM

## 2021-09-02 DIAGNOSIS — Z713 Dietary counseling and surveillance: Secondary | ICD-10-CM

## 2021-09-02 DIAGNOSIS — J4521 Mild intermittent asthma with (acute) exacerbation: Secondary | ICD-10-CM | POA: Diagnosis not present

## 2021-09-02 DIAGNOSIS — E785 Hyperlipidemia, unspecified: Secondary | ICD-10-CM | POA: Diagnosis not present

## 2021-09-02 DIAGNOSIS — E559 Vitamin D deficiency, unspecified: Secondary | ICD-10-CM

## 2021-09-02 MED ORDER — SEMAGLUTIDE (2 MG/DOSE) 8 MG/3ML ~~LOC~~ SOPN: 2.0000 mg | PEN_INJECTOR | SUBCUTANEOUS | 3 refills | Status: DC

## 2021-09-02 NOTE — Patient Instructions (Signed)
Calorie Counting for Weight Loss Calories are units of energy. Your body needs a certain number of calories from food to keep going throughout the day. When you eat or drink more calories than your body needs, your body stores the extra calories mostly as fat. When you eat or drink fewer calories than your body needs, your body burns fat to get the energy it needs. Calorie counting means keeping track of how many calories you eat and drink each day. Calorie counting can be helpful if you need to lose weight. If you eat fewer calories than your body needs, you should lose weight. Ask your health care provider what a healthy weight is for you. For calorie counting to work, you will need to eat the right number of calories each day to lose a healthy amount of weight per week. A dietitian can help you figure out how many calories you need in a day and will suggest ways to reach your calorie goal. A healthy amount of weight to lose each week is usually 1-2 lb (0.5-0.9 kg). This usually means that your daily calorie intake should be reduced by 500-750 calories. Eating 1,200-1,500 calories a day can help most women lose weight. Eating 1,500-1,800 calories a day can help most men lose weight. What do I need to know about calorie counting? Work with your health care provider or dietitian to determine how many calories you should get each day. To meet your daily calorie goal, you will need to: Find out how many calories are in each food that you would like to eat. Try to do this before you eat. Decide how much of the food you plan to eat. Keep a food log. Do this by writing down what you ate and how many calories it had. To successfully lose weight, it is important to balance calorie counting with a healthy lifestyle that includes regular activity. Where do I find calorie information?  The number of calories in a food can be found on a Nutrition Facts label. If a food does not have a Nutrition Facts label, try  to look up the calories online or ask your dietitian for help. Remember that calories are listed per serving. If you choose to have more than one serving of a food, you will have to multiply the calories per serving by the number of servings you plan to eat. For example, the label on a package of bread might say that a serving size is 1 slice and that there are 90 calories in a serving. If you eat 1 slice, you will have eaten 90 calories. If you eat 2 slices, you will have eaten 180 calories. How do I keep a food log? After each time that you eat, record the following in your food log as soon as possible: What you ate. Be sure to include toppings, sauces, and other extras on the food. How much you ate. This can be measured in cups, ounces, or number of items. How many calories were in each food and drink. The total number of calories in the food you ate. Keep your food log near you, such as in a pocket-sized notebook or on an app or website on your mobile phone. Some programs will calculate calories for you and show you how many calories you have left to meet your daily goal. What are some portion-control tips? Know how many calories are in a serving. This will help you know how many servings you can have of a certain  food. Use a measuring cup to measure serving sizes. You could also try weighing out portions on a kitchen scale. With time, you will be able to estimate serving sizes for some foods. Take time to put servings of different foods on your favorite plates or in your favorite bowls and cups so you know what a serving looks like. Try not to eat straight from a food's packaging, such as from a bag or box. Eating straight from the package makes it hard to see how much you are eating and can lead to overeating. Put the amount you would like to eat in a cup or on a plate to make sure you are eating the right portion. Use smaller plates, glasses, and bowls for smaller portions and to prevent  overeating. Try not to multitask. For example, avoid watching TV or using your computer while eating. If it is time to eat, sit down at a table and enjoy your food. This will help you recognize when you are full. It will also help you be more mindful of what and how much you are eating. What are tips for following this plan? Reading food labels Check the calorie count compared with the serving size. The serving size may be smaller than what you are used to eating. Check the source of the calories. Try to choose foods that are high in protein, fiber, and vitamins, and low in saturated fat, trans fat, and sodium. Shopping Read nutrition labels while you shop. This will help you make healthy decisions about which foods to buy. Pay attention to nutrition labels for low-fat or fat-free foods. These foods sometimes have the same number of calories or more calories than the full-fat versions. They also often have added sugar, starch, or salt to make up for flavor that was removed with the fat. Make a grocery list of lower-calorie foods and stick to it. Cooking Try to cook your favorite foods in a healthier way. For example, try baking instead of frying. Use low-fat dairy products. Meal planning Use more fruits and vegetables. One-half of your plate should be fruits and vegetables. Include lean proteins, such as chicken, [place], and fish. Lifestyle Each week, aim to do one of the following: 150 minutes of moderate exercise, such as walking. 75 minutes of vigorous exercise, such as running. General information Know how many calories are in the foods you eat most often. This will help you calculate calorie counts faster. Find a way of tracking calories that works for you. Get creative. Try different apps or programs if writing down calories does not work for you. What foods should I eat?  Eat nutritious foods. It is better to have a nutritious, high-calorie food, such as an avocado, than a food with  few nutrients, such as a bag of potato chips. Use your calories on foods and drinks that will fill you up and will not leave you hungry soon after eating. Examples of foods that fill you up are nuts and nut butters, vegetables, lean proteins, and high-fiber foods such as whole grains. High-fiber foods are foods with more than 5 g of fiber per serving. Pay attention to calories in drinks. Low-calorie drinks include water and unsweetened drinks. The items listed above may not be a complete list of foods and beverages you can eat. Contact a dietitian for more information. What foods should I limit? Limit foods or drinks that are not good sources of vitamins, minerals, or protein or that are high in unhealthy fats. These  include: Candy. Other sweets. Sodas, specialty coffee drinks, alcohol, and juice. The items listed above may not be a complete list of foods and beverages you should avoid. Contact a dietitian for more information. How do I count calories when eating out? Pay attention to portions. Often, portions are much larger when eating out. Try these tips to keep portions smaller: Consider sharing a meal instead of getting your own. If you get your own meal, eat only half of it. Before you start eating, ask for a container and put half of your meal into it. When available, consider ordering smaller portions from the menu instead of full portions. Pay attention to your food and drink choices. Knowing the way food is cooked and what is included with the meal can help you eat fewer calories. If calories are listed on the menu, choose the lower-calorie options. Choose dishes that include vegetables, fruits, whole grains, low-fat dairy products, and lean proteins. Choose items that are boiled, broiled, grilled, or steamed. Avoid items that are buttered, battered, fried, or served with cream sauce. Items labeled as crispy are usually fried, unless stated otherwise. Choose water, low-fat milk,  unsweetened iced tea, or other drinks without added sugar. If you want an alcoholic beverage, choose a lower-calorie option, such as a glass of wine or light beer. Ask for dressings, sauces, and syrups on the side. These are usually high in calories, so you should limit the amount you eat. If you want a salad, choose a garden salad and ask for grilled meats. Avoid extra toppings such as bacon, cheese, or fried items. Ask for the dressing on the side, or ask for olive oil and vinegar or lemon to use as dressing. Estimate how many servings of a food you are given. Knowing serving sizes will help you be aware of how much food you are eating at restaurants. Where to find more information Centers for Disease Control and Prevention: http://www.wolf.info/ U.[place]. Department of Agriculture: http://www.wilson-mendoza.org/ Summary Calorie counting means keeping track of how many calories you eat and drink each day. If you eat fewer calories than your body needs, you should lose weight. A healthy amount of weight to lose per week is usually 1-2 lb (0.5-0.9 kg). This usually means reducing your daily calorie intake by 500-750 calories. The number of calories in a food can be found on a Nutrition Facts label. If a food does not have a Nutrition Facts label, try to look up the calories online or ask your dietitian for help. Use smaller plates, glasses, and bowls for smaller portions and to prevent overeating. Use your calories on foods and drinks that will fill you up and not leave you hungry shortly after a meal. This information is not intended to replace advice given to you by your health care provider. Make sure you discuss any questions you have with your health care provider. Document Revised: 05/08/2019 Document Reviewed: 05/08/2019 Elsevier Patient Education  [place].

## 2021-09-02 NOTE — Progress Notes (Signed)
Subjective:    Patient ID: Lauren [name], female    DOB: 1975-10-16, 46 y.o.   MRN: [id number]  Chief Complaint  Patient presents with   Medical Management of Chronic Issues   Pt presents to the office today chronic follow up and discuss weight loss. She is currently taking Ozempic 1 mg.    Since starting on injectable medications she is down 30 lbs. She is walking two miles three times a week. Hypertension This is a chronic problem. The current episode started more than 1 year ago. The problem has been resolved since onset. The problem is controlled. Associated symptoms include malaise/fatigue. Pertinent negatives include no peripheral edema or shortness of breath. Risk factors for coronary artery disease include dyslipidemia, obesity and sedentary lifestyle. The current treatment provides moderate improvement. There is no history of CVA or heart failure.  Asthma There is no cough, shortness of breath or wheezing. This is a chronic problem. The current episode started more than 1 year ago. The problem occurs intermittently. Associated symptoms include malaise/fatigue. She reports moderate improvement on treatment. Her past medical history is significant for asthma.  Hyperlipidemia This is a chronic problem. The current episode started more than 1 year ago. Exacerbating diseases include obesity. Pertinent negatives include no shortness of breath. Current antihyperlipidemic treatment includes diet change. The current treatment provides mild improvement of lipids. Risk factors for coronary artery disease include dyslipidemia, hypertension and a sedentary lifestyle.     Review of Systems  Constitutional:  Positive for malaise/fatigue.  Respiratory:  Negative for cough, shortness of breath and wheezing.   All other systems reviewed and are negative.     Objective:   Physical Exam Vitals reviewed.  Constitutional:      General: She is not in acute distress.    Appearance: She is  well-developed. She is obese.  HENT:     Head: Normocephalic and atraumatic.     Right Ear: Tympanic membrane normal.     Left Ear: Tympanic membrane normal.  Eyes:     Pupils: Pupils are equal, round, and reactive to light.  Neck:     Thyroid: No thyromegaly.  Cardiovascular:     Rate and Rhythm: Normal rate and regular rhythm.     Heart sounds: Normal heart sounds. No murmur heard. Pulmonary:     Effort: Pulmonary effort is normal. No respiratory distress.     Breath sounds: Normal breath sounds. No wheezing.  Abdominal:     General: Bowel sounds are normal. There is no distension.     Palpations: Abdomen is soft.     Tenderness: There is no abdominal tenderness.  Musculoskeletal:        General: No tenderness. Normal range of motion.     Cervical back: Normal range of motion and neck supple.  Skin:    General: Skin is warm and dry.  Neurological:     Mental Status: She is alert and oriented to person, place, and time.     Cranial Nerves: No cranial nerve deficit.     Deep Tendon Reflexes: Reflexes are normal and symmetric.  Psychiatric:        Behavior: Behavior normal.        Thought Content: Thought content normal.        Judgment: Judgment normal.      BP 137/84   Pulse 76   Temp 97.6 F (36.4 C)   Ht 5' (1.524 m)   Wt 183 lb 3.2 oz (83.1 kg)  LMP 08/18/2012   SpO2 98%   BMI 35.78 kg/m      Assessment & Plan:  Lauren [name] comes in today with chief complaint of Medical Management of Chronic Issues   Diagnosis and orders addressed:  1. Primary hypertension - CMP14+EGFR  2. Mild intermittent asthma with acute exacerbation - CMP14+EGFR  3. Hyperlipidemia, unspecified hyperlipidemia type - CMP14+EGFR  4. Obesity (BMI 30-39.9) Will increase Ozempic to 2 mg from 1 mg Encourage healthy diet and exercise  - Semaglutide, 2 MG/DOSE, 8 MG/3ML SOPN; Inject 2 mg as directed once a week.  Dispense: 3 mL; Refill: 3 - CMP14+EGFR  5. Vitamin D  deficiency - CMP14+EGFR  6. Weight loss counseling, encounter for - Semaglutide, 2 MG/DOSE, 8 MG/3ML SOPN; Inject 2 mg as directed once a week.  Dispense: 3 mL; Refill: 3 - CMP14+EGFR   Labs pending Health Maintenance reviewed Diet and exercise encouraged  Follow up plan: 3 months    [name], FNP

## 2021-09-03 LAB — CMP14+EGFR
ALT: 23 IU/L (ref 0–32)
AST: 17 IU/L (ref 0–40)
Albumin/Globulin Ratio: 1.3 (ref 1.2–2.2)
Albumin: 4.1 g/dL (ref 3.8–4.8)
Alkaline Phosphatase: 98 IU/L (ref 44–121)
BUN/Creatinine Ratio: 15 (ref 9–23)
BUN: 11 mg/dL (ref 6–24)
Bilirubin Total: 0.3 mg/dL (ref 0.0–1.2)
CO2: 22 mmol/L (ref 20–29)
Calcium: 10.1 mg/dL (ref 8.7–10.2)
Chloride: 101 mmol/L (ref 96–106)
Creatinine, Ser: 0.72 mg/dL (ref 0.57–1.00)
Globulin, Total: 3.1 g/dL (ref 1.5–4.5)
Glucose: 88 mg/dL (ref 70–99)
Potassium: 4.4 mmol/L (ref 3.5–5.2)
Sodium: 141 mmol/L (ref 134–144)
Total Protein: 7.2 g/dL (ref 6.0–8.5)
eGFR: 104 mL/min/{1.73_m2} (ref >59)

## 2021-09-13 ENCOUNTER — Other Ambulatory Visit: Payer: Self-pay | Admitting: Family

## 2021-09-13 DIAGNOSIS — I10 Essential (primary) hypertension: Secondary | ICD-10-CM

## 2021-10-03 ENCOUNTER — Other Ambulatory Visit: Payer: Self-pay | Admitting: Family

## 2021-10-03 DIAGNOSIS — E559 Vitamin D deficiency, unspecified: Secondary | ICD-10-CM

## 2021-12-02 ENCOUNTER — Ambulatory Visit (INDEPENDENT_AMBULATORY_CARE_PROVIDER_SITE_OTHER): Payer: BC Managed Care – PPO | Admitting: Family

## 2021-12-02 ENCOUNTER — Encounter: Payer: Self-pay | Admitting: Family

## 2021-12-02 VITALS — BP 133/82 | HR 78 | Temp 97.8°F | Resp 20 | Ht 60.0 in | Wt 187.0 lb

## 2021-12-02 DIAGNOSIS — J4521 Mild intermittent asthma with (acute) exacerbation: Secondary | ICD-10-CM

## 2021-12-02 DIAGNOSIS — E785 Hyperlipidemia, unspecified: Secondary | ICD-10-CM

## 2021-12-02 DIAGNOSIS — I159 Secondary hypertension, unspecified: Secondary | ICD-10-CM | POA: Diagnosis not present

## 2021-12-02 DIAGNOSIS — E559 Vitamin D deficiency, unspecified: Secondary | ICD-10-CM | POA: Diagnosis not present

## 2021-12-02 DIAGNOSIS — Z713 Dietary counseling and surveillance: Secondary | ICD-10-CM | POA: Diagnosis not present

## 2021-12-02 DIAGNOSIS — L709 Acne, unspecified: Secondary | ICD-10-CM

## 2021-12-02 MED ORDER — SEMAGLUTIDE-WEIGHT MANAGEMENT 1 MG/0.5ML ~~LOC~~ SOAJ: 1.0000 mg | SUBCUTANEOUS | 0 refills | Status: DC

## 2021-12-02 MED ORDER — VITAMIN D (ERGOCALCIFEROL) 1.25 MG (50000 UNIT) PO CAPS: 50000.0000 [IU] | ORAL_CAPSULE | ORAL | 0 refills | Status: DC

## 2021-12-02 MED ORDER — CLINDAMYCIN PHOS-BENZOYL PEROX 1-5 % EX GEL: Freq: Two times a day (BID) | CUTANEOUS | 3 refills | Status: DC

## 2021-12-02 MED ORDER — SEMAGLUTIDE-WEIGHT MANAGEMENT 1.7 MG/0.75ML ~~LOC~~ SOAJ: 1.7000 mg | SUBCUTANEOUS | 2 refills | Status: DC

## 2021-12-02 NOTE — Patient Instructions (Signed)

## 2021-12-02 NOTE — Progress Notes (Signed)
Subjective:    Patient ID: Lauren [name], female    DOB: 06-07-1975, 46 y.o.   MRN: [id number]  Chief Complaint  Patient presents with   Medical Management of Chronic Issues   Pt presents to the office today chronic follow up and discuss weight loss. She was taking Ozempic 2 mg, however, has not had any since 10/30/21.   Since starting on injectable medications she is down 26 lbs. She is walking two miles three times a week.     12/02/2021    9:19 AM 09/02/2021    8:13 AM 06/02/2021    2:00 PM  Last 3 Weights  Weight (lbs) 187 lb 183 lb 3.2 oz 183 lb 9.6 oz  Weight (kg) 84.823 kg 83.099 kg 83.28 kg     She is morbid obese with a BMI of 36 with HTN and hyperlipidemia.  Hypertension This is a chronic problem. The current episode started more than 1 year ago. The problem has been resolved since onset. The problem is controlled. Pertinent negatives include no malaise/fatigue, peripheral edema, shortness of breath or sweats. Risk factors for coronary artery disease include dyslipidemia, obesity and sedentary lifestyle. The current treatment provides moderate improvement.  Hyperlipidemia This is a chronic problem. The current episode started more than 1 year ago. The problem is uncontrolled. Exacerbating diseases include obesity. Pertinent negatives include no shortness of breath. Current antihyperlipidemic treatment includes diet change. The current treatment provides mild improvement of lipids.  Asthma There is no cough, shortness of breath or wheezing. This is a chronic problem. The current episode started more than 1 year ago. Pertinent negatives include no malaise/fatigue or sweats. Her symptoms are aggravated by pollen. She reports moderate improvement on treatment. Her past medical history is significant for asthma.      Review of Systems  Constitutional:  Negative for malaise/fatigue.  Respiratory:  Negative for cough, shortness of breath and wheezing.   All other systems  reviewed and are negative.      Objective:   Physical Exam Vitals reviewed.  Constitutional:      General: She is not in acute distress.    Appearance: She is well-developed. She is obese.  HENT:     Head: Normocephalic and atraumatic.     Right Ear: Tympanic membrane normal.     Left Ear: Tympanic membrane normal.  Eyes:     Pupils: Pupils are equal, round, and reactive to light.  Neck:     Thyroid: No thyromegaly.  Cardiovascular:     Rate and Rhythm: Normal rate and regular rhythm.     Heart sounds: Normal heart sounds. No murmur heard. Pulmonary:     Effort: Pulmonary effort is normal. No respiratory distress.     Breath sounds: Normal breath sounds. No wheezing.  Abdominal:     General: Bowel sounds are normal. There is no distension.     Palpations: Abdomen is soft.     Tenderness: There is no abdominal tenderness.  Musculoskeletal:        General: No tenderness. Normal range of motion.     Cervical back: Normal range of motion and neck supple.  Skin:    General: Skin is warm and dry.  Neurological:     Mental Status: She is alert and oriented to person, place, and time.     Cranial Nerves: No cranial nerve deficit.     Deep Tendon Reflexes: Reflexes are normal and symmetric.  Psychiatric:        Behavior:  Behavior normal.        Thought Content: Thought content normal.        Judgment: Judgment normal.       BP 133/82   Pulse 78   Temp 97.8 F (36.6 C) (Oral)   Resp 20   Ht 5' (1.524 m)   Wt 187 lb (84.8 kg)   LMP 08/18/2012   SpO2 95%   BMI 36.52 kg/m      Assessment & Plan:  Lauren [name] comes in today with chief complaint of Medical Management of Chronic Issues   Diagnosis and orders addressed:  1. Morbid obesity ([place])  - Semaglutide-Weight Management 1 MG/0.5ML SOAJ; Inject 1 mg into the skin once a week for 28 days.  Dispense: 2 mL; Refill: 0 - Semaglutide-Weight Management 1.7 MG/0.75ML SOAJ; Inject 1.7 mg into the skin once a week.   Dispense: 3 mL; Refill: 2  2. Weight loss counseling, encounter for Will change Wegovy to 1 mg for one month then increase 1.7 mg  Encourage exercising  - Semaglutide-Weight Management 1 MG/0.5ML SOAJ; Inject 1 mg into the skin once a week for 28 days.  Dispense: 2 mL; Refill: 0 - Semaglutide-Weight Management 1.7 MG/0.75ML SOAJ; Inject 1.7 mg into the skin once a week.  Dispense: 3 mL; Refill: 2  3. Vitamin D deficiency - Vitamin D, Ergocalciferol, (DRISDOL) 1.25 MG (50000 UNIT) CAPS capsule; Take 1 capsule (50,000 Units total) by mouth every 7 (seven) days.  Dispense: 12 capsule; Refill: 0  4. Acne, unspecified acne type  - clindamycin-benzoyl peroxide (BENZACLIN) gel; Apply topically 2 (two) times daily.  Dispense: 50 g; Refill: 3  5. Hyperlipidemia, unspecified hyperlipidemia type - Semaglutide-Weight Management 1 MG/0.5ML SOAJ; Inject 1 mg into the skin once a week for 28 days.  Dispense: 2 mL; Refill: 0 - Semaglutide-Weight Management 1.7 MG/0.75ML SOAJ; Inject 1.7 mg into the skin once a week.  Dispense: 3 mL; Refill: 2  6. Secondary hypertension - Semaglutide-Weight Management 1 MG/0.5ML SOAJ; Inject 1 mg into the skin once a week for 28 days.  Dispense: 2 mL; Refill: 0 - Semaglutide-Weight Management 1.7 MG/0.75ML SOAJ; Inject 1.7 mg into the skin once a week.  Dispense: 3 mL; Refill: 2  7. Mild intermittent asthma with acute exacerbation   Labs pending Health Maintenance reviewed Diet and exercise encouraged  Follow up plan: 2 months    [name], FNP

## 2021-12-06 ENCOUNTER — Telehealth: Payer: Self-pay

## 2021-12-06 NOTE — Telephone Encounter (Signed)
SHANEEQUA BAHNER (Key: [id number]) Rx #: [id number] '1MG'$ /0.5ML auto-injectors   Form [employer] Electronic PA Form (2017 NCPDP) Created 4 days ago Sent to Plan 2 minutes ago Plan Response 2 minutes ago Submit Clinical Questions less than a minute ago Determination Wait for Determination Please wait for [employer] NCPDP 2017 to return a determination.

## 2021-12-06 NOTE — Telephone Encounter (Signed)
Lauren [name] (Key: [id number]) Rx #: [id number] 1.'7MG'$ /0.75ML auto-injectors   Form [employer] Electronic PA Form [phone] NCPDP) Created 4 days ago Sent to Plan 1 minute ago Plan Response less than a minute ago Submit Clinical Questions less than a minute ago Determination Wait for Determination Please wait for [employer] NCPDP 2017 to return a determination.

## 2021-12-07 NOTE — Telephone Encounter (Signed)
This request has received an approval. View the bottom of the request for an electronic copy of the approval letter. Approved through 12/06/21-07/04/22

## 2021-12-14 NOTE — Telephone Encounter (Signed)
Lauren [name] (Key: [id number]) Rx #: [id number] 1.'7MG'$ /0.75ML auto-injectors   Form [employer] Electronic PA Form [id number] NCPDP) Created 12 days ago Sent to Plan 8 days ago Plan Response 8 days ago Submit Clinical Questions 8 days ago Determination Favorable 8 days ago Your prior authorization for Mancel Parsons has been approved! MORE INFO Personalized support and financial assistance may be available through the [employer] program. For more information, and to see program requirements, click on the More Info button to the right.  Message from plan: Your PA request has been approved. Additional information will be provided in the approval communication. (Me  Pharmacy informed

## 2022-02-02 ENCOUNTER — Telehealth: Payer: Self-pay | Admitting: Family

## 2022-02-06 ENCOUNTER — Ambulatory Visit (INDEPENDENT_AMBULATORY_CARE_PROVIDER_SITE_OTHER): Payer: BC Managed Care – PPO | Admitting: Family

## 2022-02-06 ENCOUNTER — Encounter: Payer: Self-pay | Admitting: Family

## 2022-02-06 VITALS — BP 117/75 | HR 83 | Temp 97.8°F | Ht 60.0 in | Wt 185.8 lb

## 2022-02-06 DIAGNOSIS — Z713 Dietary counseling and surveillance: Secondary | ICD-10-CM | POA: Diagnosis not present

## 2022-02-06 DIAGNOSIS — E785 Hyperlipidemia, unspecified: Secondary | ICD-10-CM

## 2022-02-06 DIAGNOSIS — E669 Obesity, unspecified: Secondary | ICD-10-CM

## 2022-02-06 DIAGNOSIS — J4521 Mild intermittent asthma with (acute) exacerbation: Secondary | ICD-10-CM

## 2022-02-06 DIAGNOSIS — I159 Secondary hypertension, unspecified: Secondary | ICD-10-CM

## 2022-02-06 MED ORDER — SEMAGLUTIDE-WEIGHT MANAGEMENT 1.7 MG/0.75ML ~~LOC~~ SOAJ: 1.7000 mg | SUBCUTANEOUS | 2 refills | Status: DC

## 2022-02-06 NOTE — Progress Notes (Signed)
Subjective:    Patient ID: Lauren [name], female    DOB: Mar 17, 1976, 46 y.o.   MRN: [id number]  Chief Complaint  Patient presents with   Follow-up    Pt presents to the office today chronic follow up and discuss weight loss. She was taking Wegovy 1.7 mg.   Since starting on injectable medications she is down 26 lbs. She is walking two miles three times a week.     02/06/2022    2:51 PM 12/02/2021    9:19 AM 09/02/2021    8:13 AM  Last 3 Weights  Weight (lbs) 185 lb 12.8 oz 187 lb 183 lb 3.2 oz  Weight (kg) 84.278 kg 84.823 kg 83.099 kg      She is morbid obese with a BMI of 36 with HTN and hyperlipidemia.   Hypertension This is a chronic problem. The current episode started more than 1 year ago. The problem has been resolved since onset. The problem is controlled. Pertinent negatives include no malaise/fatigue, peripheral edema or shortness of breath. Risk factors for coronary artery disease include dyslipidemia, obesity and sedentary lifestyle. The current treatment provides moderate improvement. There is no history of CVA or heart failure.  Asthma There is no cough or shortness of breath. This is a chronic problem. The current episode started more than 1 year ago. The problem occurs intermittently. Pertinent negatives include no malaise/fatigue. She reports moderate improvement on treatment. Her past medical history is significant for asthma.  Hyperlipidemia This is a chronic problem. The current episode started more than 1 year ago. The problem is controlled. Recent lipid tests were reviewed and are normal. Exacerbating diseases include obesity. Pertinent negatives include no shortness of breath. The current treatment provides moderate improvement of lipids. Risk factors for coronary artery disease include dyslipidemia, hypertension and a sedentary lifestyle.      Review of Systems  Constitutional:  Negative for malaise/fatigue.  Respiratory:  Negative for cough and  shortness of breath.   All other systems reviewed and are negative.      Objective:   Physical Exam Vitals reviewed.  Constitutional:      General: She is not in acute distress.    Appearance: She is well-developed.  HENT:     Head: Normocephalic and atraumatic.  Eyes:     Pupils: Pupils are equal, round, and reactive to light.  Neck:     Thyroid: No thyromegaly.  Cardiovascular:     Rate and Rhythm: Normal rate and regular rhythm.     Heart sounds: Normal heart sounds. No murmur heard. Pulmonary:     Effort: Pulmonary effort is normal. No respiratory distress.     Breath sounds: Normal breath sounds. No wheezing.  Abdominal:     General: Bowel sounds are normal. There is no distension.     Palpations: Abdomen is soft.     Tenderness: There is no abdominal tenderness.  Musculoskeletal:        General: No tenderness. Normal range of motion.     Cervical back: Normal range of motion and neck supple.  Skin:    General: Skin is warm and dry.  Neurological:     Mental Status: She is alert and oriented to person, place, and time.     Cranial Nerves: No cranial nerve deficit.     Deep Tendon Reflexes: Reflexes are normal and symmetric.  Psychiatric:        Behavior: Behavior normal.        Thought Content: Thought  content normal.        Judgment: Judgment normal.       BP 117/75   Pulse 83   Temp 97.8 F (36.6 C) (Temporal)   Ht 5' (1.524 m)   Wt 185 lb 12.8 oz (84.3 kg)   LMP 08/18/2012   BMI 36.29 kg/m      Assessment & Plan:  ANASTON KOEHN comes in today with chief complaint of Follow-up   Diagnosis and orders addressed:  1. Morbid obesity ([place]) - Semaglutide-Weight Management 1.7 MG/0.75ML SOAJ; Inject 1.7 mg into the skin once a week.  Dispense: 3 mL; Refill: 2  2. Weight loss counseling, encounter for - Semaglutide-Weight Management 1.7 MG/0.75ML SOAJ; Inject 1.7 mg into the skin once a week.  Dispense: 3 mL; Refill: 2  3. Hyperlipidemia,  unspecified hyperlipidemia type - Semaglutide-Weight Management 1.7 MG/0.75ML SOAJ; Inject 1.7 mg into the skin once a week.  Dispense: 3 mL; Refill: 2  4. Secondary hypertension - Semaglutide-Weight Management 1.7 MG/0.75ML SOAJ; Inject 1.7 mg into the skin once a week.  Dispense: 3 mL; Refill: 2  5. Mild intermittent asthma with acute exacerbation  6. Obesity (BMI 30-39.9)   Will continue Wegovy 1.7 mg  Continue exercise and healthy diet Health Maintenance reviewed Diet and exercise encouraged  Follow up plan: 3 months    [name], FNP

## 2022-02-06 NOTE — Patient Instructions (Signed)

## 2022-02-28 DIAGNOSIS — E669 Obesity, unspecified: Secondary | ICD-10-CM

## 2022-02-28 DIAGNOSIS — I159 Secondary hypertension, unspecified: Secondary | ICD-10-CM

## 2022-02-28 DIAGNOSIS — E785 Hyperlipidemia, unspecified: Secondary | ICD-10-CM

## 2022-02-28 MED ORDER — WEGOVY 2.4 MG/0.75ML ~~LOC~~ SOAJ: 2.4000 mg | SUBCUTANEOUS | 2 refills | Status: DC

## 2022-03-20 ENCOUNTER — Other Ambulatory Visit: Payer: Self-pay | Admitting: Family

## 2022-03-20 DIAGNOSIS — E559 Vitamin D deficiency, unspecified: Secondary | ICD-10-CM

## 2022-04-18 ENCOUNTER — Encounter: Payer: Self-pay | Admitting: Family

## 2022-04-18 ENCOUNTER — Other Ambulatory Visit: Payer: Self-pay

## 2022-04-18 ENCOUNTER — Telehealth: Payer: BC Managed Care – PPO | Admitting: Family

## 2022-04-18 DIAGNOSIS — R0981 Nasal congestion: Secondary | ICD-10-CM | POA: Diagnosis not present

## 2022-04-18 DIAGNOSIS — R051 Acute cough: Secondary | ICD-10-CM | POA: Diagnosis not present

## 2022-04-18 DIAGNOSIS — J4521 Mild intermittent asthma with (acute) exacerbation: Secondary | ICD-10-CM

## 2022-04-18 DIAGNOSIS — R519 Headache, unspecified: Secondary | ICD-10-CM | POA: Diagnosis not present

## 2022-04-18 DIAGNOSIS — H9209 Otalgia, unspecified ear: Secondary | ICD-10-CM

## 2022-04-18 DIAGNOSIS — R6889 Other general symptoms and signs: Secondary | ICD-10-CM

## 2022-04-18 MED ORDER — ALBUTEROL SULFATE HFA 108 (90 BASE) MCG/ACT IN AERS: 2.0000 | INHALATION_SPRAY | Freq: Four times a day (QID) | RESPIRATORY_TRACT | 3 refills | Status: DC | PRN

## 2022-04-18 MED ORDER — PREDNISONE 10 MG (21) PO TBPK: ORAL_TABLET | ORAL | 0 refills | Status: DC

## 2022-04-18 NOTE — Progress Notes (Signed)
Virtual Visit Consent   Lauren [name], you are scheduled for a virtual visit with a [facility] provider today. Just as with appointments in the office, your consent must be obtained to participate. Your consent will be active for this visit and any virtual visit you may have with one of our providers in the next 365 days. If you have a MyChart account, a copy of this consent can be sent to you electronically.  As this is a virtual visit, video technology does not allow for your provider to perform a traditional examination. This may limit your provider's ability to fully assess your condition. If your provider identifies any concerns that need to be evaluated in person or the need to arrange testing (such as labs, EKG, etc.), we will make arrangements to do so. Although advances in technology are sophisticated, we cannot ensure that it will always work on either your end or our end. If the connection with a video visit is poor, the visit may have to be switched to a telephone visit. With either a video or telephone visit, we are not always able to ensure that we have a secure connection.  By engaging in this virtual visit, you consent to the provision of healthcare and authorize for your insurance to be billed (if applicable) for the services provided during this visit. Depending on your insurance coverage, you may receive a charge related to this service.  I need to obtain your verbal consent now. Are you willing to proceed with your visit today? TULSI CROSSETT has provided verbal consent on 04/18/2022 for a virtual visit (video or telephone). [name], FNP  Date: 04/18/2022 11:55 AM  Virtual Visit via Video Note   I, [name], connected with  SHERESA CULLOP  ([id number], 12/27/1975) on 04/18/22 at 11:55 AM EST by a video-enabled telemedicine application and verified that I am speaking with the correct person using two identifiers.  Location: Patient: Virtual Visit Location Patient:  Home Provider: Virtual Visit Location Provider: Home Office   I discussed the limitations of evaluation and management by telemedicine and the availability of in person appointments. The patient expressed understanding and agreed to proceed.    History of Present Illness: Lauren [name] is a 47 y.o. who identifies as a female who was assigned female at birth, and is being seen today for nasal congestion and congestion that started 5 days ago. Did a home COVID test that was negative.   HPI: URI  This is a new problem. The current episode started in the past 7 days. The problem has been gradually worsening. There has been no fever. Associated symptoms include congestion, coughing, ear pain, headaches, joint swelling, rhinorrhea and sinus pain. She has tried acetaminophen and increased fluids for the symptoms. The treatment provided mild relief.    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   Obesity (BMI 30-39.9) 02/26/2018   Asthma 09/25/2017   Hyperlipidemia 05/15/2014   Vitamin D deficiency 05/15/2014    Allergies:  Allergies  Allergen Reactions   Sulfa Antibiotics Other (See Comments)    Unknown   Medications:  Current Outpatient Medications:    predniSONE (STERAPRED UNI-PAK 21 TAB) 10 MG (21) TBPK tablet, Use as directed, Disp: 21 tablet, Rfl: 0   albuterol (PROVENTIL HFA) 108 (90 Base) MCG/ACT inhaler, Inhale 2 puffs into the lungs every 6 (six) hours as needed for wheezing or shortness of breath.,  Disp: 8 g, Rfl: 3   Ascorbic Acid (VITAMIN C PO), Take by mouth., Disp: , Rfl:    clindamycin-benzoyl peroxide (BENZACLIN) gel, Apply topically 2 (two) times daily., Disp: 50 g, Rfl: 3   Cyanocobalamin (B-12 PO), Take by mouth., Disp: , Rfl:    Semaglutide-Weight Management (WEGOVY) 2.4 MG/0.75ML SOAJ, Inject 2.4 mg into the skin once a week., Disp: 3 mL, Rfl: 2   valACYclovir (VALTREX) 1000 MG tablet, Take 2  tablets (2,000 mg total) by mouth 2 (two) times daily., Disp: 4 tablet, Rfl: 5   Vitamin D, Ergocalciferol, (DRISDOL) 1.25 MG (50000 UNIT) CAPS capsule, TAKE 1 CAPSULE (50,000 UNITS TOTAL) BY MOUTH EVERY 7 (SEVEN) DAYS, Disp: 12 capsule, Rfl: 0  Observations/Objective: Patient is well-developed, well-nourished in no acute distress.  Resting comfortably  at home.  Head is normocephalic, atraumatic.  No labored breathing.  Speech is clear and coherent with logical content.  Patient is alert and oriented at baseline.  Nasal congestion   Assessment and Plan: 1. Flu-like symptoms - predniSONE (STERAPRED UNI-PAK 21 TAB) 10 MG (21) TBPK tablet; Use as directed  Dispense: 21 tablet; Refill: 0  Force fluids Tylenol as needed Droplet precautions  Follow up if symptoms worsen or do not improve   Follow Up Instructions: I discussed the assessment and treatment plan with the patient. The patient was provided an opportunity to ask questions and all were answered. The patient agreed with the plan and demonstrated an understanding of the instructions.  A copy of instructions were sent to the patient via MyChart unless otherwise noted below.    The patient was advised to call back or seek an in-person evaluation if the symptoms worsen or if the condition fails to improve as anticipated.  Time:  I spent 6 minutes with the patient via telehealth technology discussing the above problems/concerns.    [name], FNP

## 2022-05-09 ENCOUNTER — Encounter: Payer: Self-pay | Admitting: Family

## 2022-05-09 ENCOUNTER — Ambulatory Visit (INDEPENDENT_AMBULATORY_CARE_PROVIDER_SITE_OTHER): Payer: BC Managed Care – PPO | Admitting: Family

## 2022-05-09 VITALS — BP 128/87 | HR 86 | Temp 97.7°F | Ht 60.0 in | Wt 188.2 lb

## 2022-05-09 DIAGNOSIS — E559 Vitamin D deficiency, unspecified: Secondary | ICD-10-CM

## 2022-05-09 DIAGNOSIS — E785 Hyperlipidemia, unspecified: Secondary | ICD-10-CM | POA: Diagnosis not present

## 2022-05-09 DIAGNOSIS — Z713 Dietary counseling and surveillance: Secondary | ICD-10-CM

## 2022-05-09 DIAGNOSIS — E669 Obesity, unspecified: Secondary | ICD-10-CM

## 2022-05-09 DIAGNOSIS — L709 Acne, unspecified: Secondary | ICD-10-CM

## 2022-05-09 DIAGNOSIS — I159 Secondary hypertension, unspecified: Secondary | ICD-10-CM

## 2022-05-09 DIAGNOSIS — J4521 Mild intermittent asthma with (acute) exacerbation: Secondary | ICD-10-CM

## 2022-05-09 DIAGNOSIS — Z0001 Encounter for general adult medical examination with abnormal findings: Secondary | ICD-10-CM

## 2022-05-09 DIAGNOSIS — Z0000 Encounter for general adult medical examination without abnormal findings: Secondary | ICD-10-CM

## 2022-05-09 MED ORDER — CLINDAMYCIN PHOS-BENZOYL PEROX 1-5 % EX GEL: Freq: Two times a day (BID) | CUTANEOUS | 3 refills | Status: DC

## 2022-05-09 MED ORDER — WEGOVY 2.4 MG/0.75ML ~~LOC~~ SOAJ: 2.4000 mg | SUBCUTANEOUS | 2 refills | Status: DC

## 2022-05-09 NOTE — Patient Instructions (Signed)

## 2022-05-09 NOTE — Progress Notes (Signed)
Subjective:    Patient ID: Lauren [name], female    DOB: Jun 08, 1975, 47 y.o.   MRN: [id number]  Chief Complaint  Patient presents with   Medical Management of Chronic Issues    Pt presents to the office today chronic follow up and discuss weight loss. She was taking Wegovy 2.4 mg.   Since starting on injectable medications she is down 26 lbs. She is walking two miles three times a week.  Patient's BMI is >30 mg/m2.  Patient's current BMI is Body mass index is 36.76 kg/m.Marland Kitchen  Patient has lost and maintained a 5% body weight loss.  Patient is currently enrolled in a healthy eating plan along with encouraged exercise.   Patient does not have a personal or family history of medullary thyroid carcinoma (MTC) or Multiple Endocrine Neoplasia syndrome type 2 (MEN 2).      05/09/2022    8:18 AM 02/06/2022    2:51 PM 12/02/2021    9:19 AM  Last 3 Weights  Weight (lbs) 188 lb 3.2 oz 185 lb 12.8 oz 187 lb  Weight (kg) 85.367 kg 84.278 kg 84.823 kg     Hypertension This is a chronic problem. The current episode started more than 1 year ago. The problem has been resolved since onset. The problem is controlled. Associated symptoms include malaise/fatigue. Pertinent negatives include no peripheral edema or shortness of breath. Risk factors for coronary artery disease include dyslipidemia and obesity. The current treatment provides moderate improvement. There is no history of heart failure.  Asthma There is no cough, shortness of breath or wheezing. This is a chronic problem. The current episode started more than 1 year ago. The problem occurs intermittently. Associated symptoms include malaise/fatigue. Her symptoms are alleviated by rest. She reports moderate improvement on treatment. Her past medical history is significant for asthma.  Hyperlipidemia This is a chronic problem. The current episode started more than 1 year ago. The problem is uncontrolled. Exacerbating diseases include obesity.  Pertinent negatives include no shortness of breath. Current antihyperlipidemic treatment includes statins. The current treatment provides mild improvement of lipids. Risk factors for coronary artery disease include diabetes mellitus, dyslipidemia, hypertension and a sedentary lifestyle.      Review of Systems  Constitutional:  Positive for malaise/fatigue.  Respiratory:  Negative for cough, shortness of breath and wheezing.   All other systems reviewed and are negative.  Family History  Problem Relation Age of Onset   Heart disease Father    Diabetes Father    Social History   Socioeconomic History   Marital status: Married    Spouse name: Not on file   Number of children: Not on file   Years of education: Not on file   Highest education level: Not on file  Occupational History   Not on file  Tobacco Use   Smoking status: Never   Smokeless tobacco: Never  Vaping Use   Vaping Use: Never used  Substance and Sexual Activity   Alcohol use: No   Drug use: No   Sexual activity: Yes    Birth control/protection: Surgical  Other Topics Concern   Not on file  Social History Narrative   Not on file   Social Determinants of Health   Financial Resource Strain: Not on file  Food Insecurity: Not on file  Transportation Needs: Not on file  Physical Activity: Not on file  Stress: Not on file  Social Connections: Not on file       Objective:  Physical Exam Vitals reviewed.  Constitutional:      General: She is not in acute distress.    Appearance: She is well-developed. She is obese.  HENT:     Head: Normocephalic and atraumatic.     Right Ear: Tympanic membrane normal.     Left Ear: Tympanic membrane normal.  Eyes:     Pupils: Pupils are equal, round, and reactive to light.  Neck:     Thyroid: No thyromegaly.  Cardiovascular:     Rate and Rhythm: Normal rate and regular rhythm.     Heart sounds: Normal heart sounds. No murmur heard. Pulmonary:     Effort:  Pulmonary effort is normal. No respiratory distress.     Breath sounds: Normal breath sounds. No wheezing.  Abdominal:     General: Bowel sounds are normal. There is no distension.     Palpations: Abdomen is soft.     Tenderness: There is no abdominal tenderness.  Musculoskeletal:        General: No tenderness. Normal range of motion.     Cervical back: Normal range of motion and neck supple.  Skin:    General: Skin is warm and dry.  Neurological:     Mental Status: She is alert and oriented to person, place, and time.     Cranial Nerves: No cranial nerve deficit.     Deep Tendon Reflexes: Reflexes are normal and symmetric.  Psychiatric:        Behavior: Behavior normal.        Thought Content: Thought content normal.        Judgment: Judgment normal.       BP 128/87   Pulse 86   Temp 97.7 F (36.5 C) (Temporal)   Ht 5' (1.524 m)   Wt 188 lb 3.2 oz (85.4 kg)   LMP 08/18/2012   SpO2 98%   BMI 36.76 kg/m      Assessment & Plan:  Lauren [name] comes in today with chief complaint of Medical Management of Chronic Issues   Diagnosis and orders addressed:  1. Acne, unspecified acne type - clindamycin-benzoyl peroxide (BENZACLIN) gel; Apply topically 2 (two) times daily.  Dispense: 50 g; Refill: 3 - CMP14+EGFR - CBC with Differential/Platelet  2. Annual physical exam - CMP14+EGFR - CBC with Differential/Platelet - Lipid panel - TSH - VITAMIN D 25 Hydroxy (Vit-D Deficiency, Fractures)  3. Mild intermittent asthma with acute exacerbation - CMP14+EGFR - CBC with Differential/Platelet  4. Hyperlipidemia, unspecified hyperlipidemia type - CMP14+EGFR - CBC with Differential/Platelet - Lipid panel - Semaglutide-Weight Management (WEGOVY) 2.4 MG/0.75ML SOAJ; Inject 2.4 mg into the skin once a week.  Dispense: 3 mL; Refill: 2  5. Secondary hypertension - CMP14+EGFR - CBC with Differential/Platelet - Semaglutide-Weight Management (WEGOVY) 2.4 MG/0.75ML SOAJ; Inject  2.4 mg into the skin once a week.  Dispense: 3 mL; Refill: 2  6. Obesity (BMI 30-39.9) - CMP14+EGFR - CBC with Differential/Platelet - Semaglutide-Weight Management (WEGOVY) 2.4 MG/0.75ML SOAJ; Inject 2.4 mg into the skin once a week.  Dispense: 3 mL; Refill: 2  7. Vitamin D deficiency - CMP14+EGFR - CBC with Differential/Platelet - VITAMIN D 25 Hydroxy (Vit-D Deficiency, Fractures)  8. Weight loss counseling, encounter for    Labs pending Health Maintenance reviewed Diet and exercise encouraged  Follow up plan: 3 months    [name], FNP

## 2022-05-10 LAB — CBC WITH DIFFERENTIAL/PLATELET
Basophils Absolute: 0 10*3/uL (ref 0.0–0.2)
Basos: 1 %
EOS (ABSOLUTE): 0.3 10*3/uL (ref 0.0–0.4)
Eos: 4 %
Hematocrit: 41.9 % (ref 34.0–46.6)
Hemoglobin: 13.9 g/dL (ref 11.1–15.9)
Immature Grans (Abs): 0 10*3/uL (ref 0.0–0.1)
Immature Granulocytes: 0 %
Lymphocytes Absolute: 1.9 10*3/uL (ref 0.7–3.1)
Lymphs: 28 %
MCH: 30.3 pg (ref 26.6–33.0)
MCHC: 33.2 g/dL (ref 31.5–35.7)
MCV: 92 fL (ref 79–97)
Monocytes Absolute: 0.5 10*3/uL (ref 0.1–0.9)
Monocytes: 7 %
Neutrophils Absolute: 4.2 10*3/uL (ref 1.4–7.0)
Neutrophils: 60 %
Platelets: 255 10*3/uL (ref 150–450)
RBC: 4.58 x10E6/uL (ref 3.77–5.28)
RDW: 12.2 % (ref 11.7–15.4)
WBC: 6.9 10*3/uL (ref 3.4–10.8)

## 2022-05-10 LAB — LIPID PANEL
Chol/HDL Ratio: 3.6 ratio (ref 0.0–4.4)
Cholesterol, Total: 182 mg/dL (ref 100–199)
HDL: 51 mg/dL (ref >39)
LDL Chol Calc (NIH): 120 mg/dL — ABNORMAL HIGH (ref 0–99)
Triglycerides: 58 mg/dL (ref 0–149)
VLDL Cholesterol Cal: 11 mg/dL (ref 5–40)

## 2022-05-10 LAB — CMP14+EGFR
ALT: 22 IU/L (ref 0–32)
AST: 16 IU/L (ref 0–40)
Albumin/Globulin Ratio: 1.4 (ref 1.2–2.2)
Albumin: 4 g/dL (ref 3.9–4.9)
Alkaline Phosphatase: 103 IU/L (ref 44–121)
BUN/Creatinine Ratio: 13 (ref 9–23)
BUN: 9 mg/dL (ref 6–24)
Bilirubin Total: 0.4 mg/dL (ref 0.0–1.2)
CO2: 23 mmol/L (ref 20–29)
Calcium: 9.6 mg/dL (ref 8.7–10.2)
Chloride: 104 mmol/L (ref 96–106)
Creatinine, Ser: 0.72 mg/dL (ref 0.57–1.00)
Globulin, Total: 2.9 g/dL (ref 1.5–4.5)
Glucose: 79 mg/dL (ref 70–99)
Potassium: 4.3 mmol/L (ref 3.5–5.2)
Sodium: 140 mmol/L (ref 134–144)
Total Protein: 6.9 g/dL (ref 6.0–8.5)
eGFR: 104 mL/min/{1.73_m2} (ref >59)

## 2022-05-10 LAB — VITAMIN D 25 HYDROXY (VIT D DEFICIENCY, FRACTURES): Vit D, 25-Hydroxy: 67.3 ng/mL (ref 30.0–100.0)

## 2022-05-10 LAB — TSH: TSH: 1 u[IU]/mL (ref 0.450–4.500)

## 2022-06-09 ENCOUNTER — Other Ambulatory Visit: Payer: Self-pay | Admitting: Family

## 2022-06-09 DIAGNOSIS — E559 Vitamin D deficiency, unspecified: Secondary | ICD-10-CM

## 2022-06-20 ENCOUNTER — Other Ambulatory Visit: Payer: Self-pay | Admitting: Family

## 2022-06-20 DIAGNOSIS — R928 Other abnormal and inconclusive findings on diagnostic imaging of breast: Secondary | ICD-10-CM

## 2022-07-08 ENCOUNTER — Ambulatory Visit
Admission: RE | Admit: 2022-07-08 | Discharge: 2022-07-08 | Disposition: A | Discharge status: Home / Self Care | Payer: BC Managed Care – PPO | Source: Ambulatory Visit | Attending: Family | Admitting: Family

## 2022-07-08 DIAGNOSIS — R928 Other abnormal and inconclusive findings on diagnostic imaging of breast: Secondary | ICD-10-CM

## 2022-08-10 ENCOUNTER — Encounter: Payer: Self-pay | Admitting: Family

## 2022-08-10 ENCOUNTER — Ambulatory Visit: Payer: BC Managed Care – PPO | Admitting: Family

## 2022-08-10 VITALS — BP 107/74 | HR 82 | Temp 97.8°F | Ht 60.0 in | Wt 185.2 lb

## 2022-08-10 DIAGNOSIS — I159 Secondary hypertension, unspecified: Secondary | ICD-10-CM | POA: Diagnosis not present

## 2022-08-10 DIAGNOSIS — J4521 Mild intermittent asthma with (acute) exacerbation: Secondary | ICD-10-CM | POA: Diagnosis not present

## 2022-08-10 DIAGNOSIS — E559 Vitamin D deficiency, unspecified: Secondary | ICD-10-CM

## 2022-08-10 DIAGNOSIS — E785 Hyperlipidemia, unspecified: Secondary | ICD-10-CM

## 2022-08-10 DIAGNOSIS — Z713 Dietary counseling and surveillance: Secondary | ICD-10-CM

## 2022-08-10 MED ORDER — PHENTERMINE HCL 37.5 MG PO TABS: 37.5000 mg | ORAL_TABLET | Freq: Every day | ORAL | 2 refills | Status: DC

## 2022-08-10 NOTE — Progress Notes (Signed)
Subjective:    Patient ID: Lauren [name], female    DOB: [birth year], 47 y.o.   MRN: [id number]  Chief Complaint  Patient presents with   Medical Management of Chronic Issues   Pt presents to the office today chronic follow up and discuss weight loss. She was taking Wegovy 2.4 mg, but has not had since March because insurance stopped paying for it. She has gained 3 lbs.   Since starting on injectable medications she is down 26 lbs. She is walking two miles three times a week.       08/10/2022    3:22 PM 05/09/2022    8:18 AM 02/06/2022    2:51 PM  Last 3 Weights  Weight (lbs) 185 lb 3.2 oz 188 lb 3.2 oz 185 lb 12.8 oz  Weight (kg) 84.006 kg 85.367 kg 84.278 kg     Asthma There is no cough, shortness of breath or wheezing. This is a chronic problem. The current episode started more than 1 year ago. The problem occurs intermittently. Pertinent negatives include no malaise/fatigue. She reports moderate improvement on treatment. Her past medical history is significant for asthma.  Hyperlipidemia This is a chronic problem. The current episode started more than 1 year ago. Exacerbating diseases include obesity. Pertinent negatives include no shortness of breath. The current treatment provides moderate improvement of lipids. Risk factors for coronary artery disease include dyslipidemia, hypertension, a sedentary lifestyle and post-menopausal.  Hypertension This is a chronic problem. The current episode started more than 1 year ago. The problem has been resolved since onset. The problem is controlled. Pertinent negatives include no malaise/fatigue, peripheral edema or shortness of breath. Risk factors for coronary artery disease include dyslipidemia, obesity and sedentary lifestyle. The current treatment provides moderate improvement.      Review of Systems  Constitutional:  Negative for malaise/fatigue.  Respiratory:  Negative for cough, shortness of breath and wheezing.   All other  systems reviewed and are negative.      Objective:   Physical Exam Vitals reviewed.  Constitutional:      General: She is not in acute distress.    Appearance: She is well-developed. She is obese.  HENT:     Head: Normocephalic and atraumatic.     Right Ear: Tympanic membrane normal.     Left Ear: Tympanic membrane normal.  Eyes:     Pupils: Pupils are equal, round, and reactive to light.  Neck:     Thyroid: No thyromegaly.  Cardiovascular:     Rate and Rhythm: Normal rate and regular rhythm.     Heart sounds: Normal heart sounds. No murmur heard. Pulmonary:     Effort: Pulmonary effort is normal. No respiratory distress.     Breath sounds: Normal breath sounds. No wheezing.  Abdominal:     General: Bowel sounds are normal. There is no distension.     Palpations: Abdomen is soft.     Tenderness: There is no abdominal tenderness.  Musculoskeletal:        General: No tenderness. Normal range of motion.     Cervical back: Normal range of motion and neck supple.  Skin:    General: Skin is warm and dry.  Neurological:     Mental Status: She is alert and oriented to person, place, and time.     Cranial Nerves: No cranial nerve deficit.     Deep Tendon Reflexes: Reflexes are normal and symmetric.  Psychiatric:        Behavior: Behavior  normal.        Thought Content: Thought content normal.        Judgment: Judgment normal.       BP 107/74   Pulse 82   Temp 97.8 F (36.6 C) (Temporal)   Ht 5' (1.524 m)   Wt 185 lb 3.2 oz (84 kg)   LMP 08/18/2012   SpO2 96%   BMI 36.17 kg/m      Assessment & Plan:  Lauren [name] comes in today with chief complaint of Medical Management of Chronic Issues   Diagnosis and orders addressed:  1. Mild intermittent asthma with acute exacerbation  2. Vitamin D deficiency  3. Morbid obesity ([facility]) - phentermine (ADIPEX-P) 37.5 MG tablet; Take 1 tablet (37.5 mg total) by mouth daily before breakfast.  Dispense: 30 tablet; Refill:  2  4. Secondary hypertension - phentermine (ADIPEX-P) 37.5 MG tablet; Take 1 tablet (37.5 mg total) by mouth daily before breakfast.  Dispense: 30 tablet; Refill: 2  5. Hyperlipidemia, unspecified hyperlipidemia type - phentermine (ADIPEX-P) 37.5 MG tablet; Take 1 tablet (37.5 mg total) by mouth daily before breakfast.  Dispense: 30 tablet; Refill: 2  6. Weight loss counseling, encounter for - phentermine (ADIPEX-P) 37.5 MG tablet; Take 1 tablet (37.5 mg total) by mouth daily before breakfast.  Dispense: 30 tablet; Refill: 2   Labs pending Will start phentermine 37.5 mg  Avoid caffeine  Health Maintenance reviewed Diet and exercise encouraged  Follow up plan: 3 months    Lauren Rodney, FNP

## 2022-08-10 NOTE — Patient Instructions (Signed)
Calorie Counting for Weight Loss Calories are units of energy. Your body needs a certain number of calories from food to keep going throughout the day. When you eat or drink more calories than your body needs, your body stores the extra calories mostly as fat. When you eat or drink fewer calories than your body needs, your body burns fat to get the energy it needs. Calorie counting means keeping track of how many calories you eat and drink each day. Calorie counting can be helpful if you need to lose weight. If you eat fewer calories than your body needs, you should lose weight. Ask your health care provider what a healthy weight is for you. For calorie counting to work, you will need to eat the right number of calories each day to lose a healthy amount of weight per week. A dietitian can help you figure out how many calories you need in a day and will suggest ways to reach your calorie goal. A healthy amount of weight to lose each week is usually 1-2 lb (0.5-0.9 kg). This usually means that your daily calorie intake should be reduced by 500-750 calories. Eating 1,200-1,500 calories a day can help most women lose weight. Eating 1,500-1,800 calories a day can help most men lose weight. What do I need to know about calorie counting? Work with your health care provider or dietitian to determine how many calories you should get each day. To meet your daily calorie goal, you will need to: Find out how many calories are in each food that you would like to eat. Try to do this before you eat. Decide how much of the food you plan to eat. Keep a food log. Do this by writing down what you ate and how many calories it had. To successfully lose weight, it is important to balance calorie counting with a healthy lifestyle that includes regular activity. Where do I find calorie information?  The number of calories in a food can be found on a Nutrition Facts label. If a food does not have a Nutrition Facts label, try  to look up the calories online or ask your dietitian for help. Remember that calories are listed per serving. If you choose to have more than one serving of a food, you will have to multiply the calories per serving by the number of servings you plan to eat. For example, the label on a package of bread might say that a serving size is 1 slice and that there are 90 calories in a serving. If you eat 1 slice, you will have eaten 90 calories. If you eat 2 slices, you will have eaten 180 calories. How do I keep a food log? After each time that you eat, record the following in your food log as soon as possible: What you ate. Be sure to include toppings, sauces, and other extras on the food. How much you ate. This can be measured in cups, ounces, or number of items. How many calories were in each food and drink. The total number of calories in the food you ate. Keep your food log near you, such as in a pocket-sized notebook or on an app or website on your mobile phone. Some programs will calculate calories for you and show you how many calories you have left to meet your daily goal. What are some portion-control tips? Know how many calories are in a serving. This will help you know how many servings you can have of a certain   food. Use a measuring cup to measure serving sizes. You could also try weighing out portions on a kitchen scale. With time, you will be able to estimate serving sizes for some foods. Take time to put servings of different foods on your favorite plates or in your favorite bowls and cups so you know what a serving looks like. Try not to eat straight from a food's packaging, such as from a bag or box. Eating straight from the package makes it hard to see how much you are eating and can lead to overeating. Put the amount you would like to eat in a cup or on a plate to make sure you are eating the right portion. Use smaller plates, glasses, and bowls for smaller portions and to prevent  overeating. Try not to multitask. For example, avoid watching TV or using your computer while eating. If it is time to eat, sit down at a table and enjoy your food. This will help you recognize when you are full. It will also help you be more mindful of what and how much you are eating. What are tips for following this plan? Reading food labels Check the calorie count compared with the serving size. The serving size may be smaller than what you are used to eating. Check the source of the calories. Try to choose foods that are high in protein, fiber, and vitamins, and low in saturated fat, trans fat, and sodium. Shopping Read nutrition labels while you shop. This will help you make healthy decisions about which foods to buy. Pay attention to nutrition labels for low-fat or fat-free foods. These foods sometimes have the same number of calories or more calories than the full-fat versions. They also often have added sugar, starch, or salt to make up for flavor that was removed with the fat. Make a grocery list of lower-calorie foods and stick to it. Cooking Try to cook your favorite foods in a healthier way. For example, try baking instead of frying. Use low-fat dairy products. Meal planning Use more fruits and vegetables. One-half of your plate should be fruits and vegetables. Include lean proteins, such as chicken, turkey, and fish. Lifestyle Each week, aim to do one of the following: 150 minutes of moderate exercise, such as walking. 75 minutes of vigorous exercise, such as running. General information Know how many calories are in the foods you eat most often. This will help you calculate calorie counts faster. Find a way of tracking calories that works for you. Get creative. Try different apps or programs if writing down calories does not work for you. What foods should I eat?  Eat nutritious foods. It is better to have a nutritious, high-calorie food, such as an avocado, than a food with  few nutrients, such as a bag of potato chips. Use your calories on foods and drinks that will fill you up and will not leave you hungry soon after eating. Examples of foods that fill you up are nuts and nut butters, vegetables, lean proteins, and high-fiber foods such as whole grains. High-fiber foods are foods with more than 5 g of fiber per serving. Pay attention to calories in drinks. Low-calorie drinks include water and unsweetened drinks. The items listed above may not be a complete list of foods and beverages you can eat. Contact a dietitian for more information. What foods should I limit? Limit foods or drinks that are not good sources of vitamins, minerals, or protein or that are high in unhealthy fats. These   include: Candy. Other sweets. Sodas, specialty coffee drinks, alcohol, and juice. The items listed above may not be a complete list of foods and beverages you should avoid. Contact a dietitian for more information. How do I count calories when eating out? Pay attention to portions. Often, portions are much larger when eating out. Try these tips to keep portions smaller: Consider sharing a meal instead of getting your own. If you get your own meal, eat only half of it. Before you start eating, ask for a container and put half of your meal into it. When available, consider ordering smaller portions from the menu instead of full portions. Pay attention to your food and drink choices. Knowing the way food is cooked and what is included with the meal can help you eat fewer calories. If calories are listed on the menu, choose the lower-calorie options. Choose dishes that include vegetables, fruits, whole grains, low-fat dairy products, and lean proteins. Choose items that are boiled, broiled, grilled, or steamed. Avoid items that are buttered, battered, fried, or served with cream sauce. Items labeled as crispy are usually fried, unless stated otherwise. Choose water, low-fat milk,  unsweetened iced tea, or other drinks without added sugar. If you want an alcoholic beverage, choose a lower-calorie option, such as a glass of wine or light beer. Ask for dressings, sauces, and syrups on the side. These are usually high in calories, so you should limit the amount you eat. If you want a salad, choose a garden salad and ask for grilled meats. Avoid extra toppings such as bacon, cheese, or fried items. Ask for the dressing on the side, or ask for olive oil and vinegar or lemon to use as dressing. Estimate how many servings of a food you are given. Knowing serving sizes will help you be aware of how much food you are eating at restaurants. Where to find more information Centers for Disease Control and Prevention: www.cdc.gov [place] Department of Agriculture: myplate.gov Summary Calorie counting means keeping track of how many calories you eat and drink each day. If you eat fewer calories than your body needs, you should lose weight. A healthy amount of weight to lose per week is usually 1-2 lb (0.5-0.9 kg). This usually means reducing your daily calorie intake by 500-750 calories. The number of calories in a food can be found on a Nutrition Facts label. If a food does not have a Nutrition Facts label, try to look up the calories online or ask your dietitian for help. Use smaller plates, glasses, and bowls for smaller portions and to prevent overeating. Use your calories on foods and drinks that will fill you up and not leave you hungry shortly after a meal. This information is not intended to replace advice given to you by your health care provider. Make sure you discuss any questions you have with your health care provider. Document Revised: 05/08/2019 Document Reviewed: 05/08/2019 Elsevier Patient Education  2023 [employer] Inc[employer]  

## 2022-09-02 ENCOUNTER — Other Ambulatory Visit: Payer: Self-pay | Admitting: Family

## 2022-09-02 DIAGNOSIS — E559 Vitamin D deficiency, unspecified: Secondary | ICD-10-CM

## 2022-11-10 ENCOUNTER — Ambulatory Visit: Payer: BC Managed Care – PPO | Admitting: Family

## 2022-11-10 ENCOUNTER — Encounter: Payer: Self-pay | Admitting: Family

## 2022-11-10 VITALS — BP 126/85 | HR 79 | Temp 97.3°F | Ht 60.0 in | Wt 189.2 lb

## 2022-11-10 DIAGNOSIS — I159 Secondary hypertension, unspecified: Secondary | ICD-10-CM

## 2022-11-10 DIAGNOSIS — E785 Hyperlipidemia, unspecified: Secondary | ICD-10-CM | POA: Diagnosis not present

## 2022-11-10 DIAGNOSIS — L709 Acne, unspecified: Secondary | ICD-10-CM

## 2022-11-10 DIAGNOSIS — B002 Herpesviral gingivostomatitis and pharyngotonsillitis: Secondary | ICD-10-CM

## 2022-11-10 DIAGNOSIS — L700 Acne vulgaris: Secondary | ICD-10-CM

## 2022-11-10 DIAGNOSIS — Z713 Dietary counseling and surveillance: Secondary | ICD-10-CM

## 2022-11-10 MED ORDER — PHENTERMINE HCL 37.5 MG PO TABS
37.5000 mg | ORAL_TABLET | Freq: Every day | ORAL | 2 refills | Status: DC
Start: 2022-11-10 — End: 2023-02-19

## 2022-11-10 MED ORDER — CLINDAMYCIN PHOS-BENZOYL PEROX 1-5 % EX GEL
Freq: Two times a day (BID) | CUTANEOUS | 3 refills | Status: DC
Start: 2022-11-10 — End: 2023-10-23

## 2022-11-10 MED ORDER — SPIRONOLACTONE 25 MG PO TABS
25.0000 mg | ORAL_TABLET | Freq: Every day | ORAL | 1 refills | Status: DC
Start: 2022-11-10 — End: 2023-05-07

## 2022-11-10 MED ORDER — VALACYCLOVIR HCL 1 G PO TABS
2000.0000 mg | ORAL_TABLET | Freq: Two times a day (BID) | ORAL | 5 refills | Status: DC
Start: 2022-11-10 — End: 2024-02-11

## 2022-11-10 NOTE — Progress Notes (Signed)
Subjective:    Patient ID: Arbutus Ped, female    DOB: [birth year], 47 y.o.   MRN: [id number]  Chief Complaint  Patient presents with   Medical Management of Chronic Issues    Spots on face on  and off     Pt presents to the office today chronic follow up and discuss weight loss. She was seen on 08/10/22 and given phentermine 37.5 mg. She has only been taking 1/2 tab.      11/10/2022    9:02 AM 08/10/2022    3:22 PM 05/09/2022    8:18 AM  Last 3 Weights  Weight (lbs) 189 lb 3.2 oz 185 lb 3.2 oz 188 lb 3.2 oz  Weight (kg) 85.821 kg 84.006 kg 85.367 kg    She is complaining of acne that is worse.   She woke up yesterday with a fever blister yesterday.  Asthma There is no cough, shortness of breath or wheezing. This is a chronic problem. The current episode started more than 1 year ago. The problem occurs intermittently. Pertinent negatives include no malaise/fatigue. Her symptoms are alleviated by rest. Her past medical history is significant for asthma.  Hypertension This is a chronic problem. The current episode started more than 1 year ago. The problem has been resolved since onset. Pertinent negatives include no malaise/fatigue, peripheral edema or shortness of breath. Risk factors for coronary artery disease include dyslipidemia and obesity. The current treatment provides moderate improvement.      Review of Systems  Constitutional:  Negative for malaise/fatigue.  Respiratory:  Negative for cough, shortness of breath and wheezing.   All other systems reviewed and are negative.      Objective:   Physical Exam Vitals reviewed.  Constitutional:      General: She is not in acute distress.    Appearance: She is well-developed.  HENT:     Head: Normocephalic and atraumatic.     Right Ear: Tympanic membrane normal.     Left Ear: Tympanic membrane normal.  Eyes:     Pupils: Pupils are equal, round, and reactive to light.  Neck:     Thyroid: No thyromegaly.   Cardiovascular:     Rate and Rhythm: Normal rate and regular rhythm.     Heart sounds: Normal heart sounds. No murmur heard. Pulmonary:     Effort: Pulmonary effort is normal. No respiratory distress.     Breath sounds: Normal breath sounds. No wheezing.  Abdominal:     General: Bowel sounds are normal. There is no distension.     Palpations: Abdomen is soft.     Tenderness: There is no abdominal tenderness.  Musculoskeletal:        General: No tenderness. Normal range of motion.     Cervical back: Normal range of motion and neck supple.  Skin:    General: Skin is warm and dry.  Neurological:     Mental Status: She is alert and oriented to person, place, and time.     Cranial Nerves: No cranial nerve deficit.     Deep Tendon Reflexes: Reflexes are normal and symmetric.  Psychiatric:        Behavior: Behavior normal.        Thought Content: Thought content normal.        Judgment: Judgment normal.         BP 126/85   Pulse 79   Temp (!) 97.3 F (36.3 C)   Ht 5' (1.524 m)   Wt 189  lb 3.2 oz (85.8 kg)   LMP 08/18/2012   SpO2 98%   BMI 36.95 kg/m   Assessment & Plan:  OPHELIA SIPE comes in today with chief complaint of Medical Management of Chronic Issues (Spots on face on  and off/)   Diagnosis and orders addressed:  1. Morbid obesity (HCC) - phentermine (ADIPEX-P) 37.5 MG tablet; Take 1 tablet (37.5 mg total) by mouth daily before breakfast.  Dispense: 30 tablet; Refill: 2  2. Secondary hypertension - phentermine (ADIPEX-P) 37.5 MG tablet; Take 1 tablet (37.5 mg total) by mouth daily before breakfast.  Dispense: 30 tablet; Refill: 2  3. Hyperlipidemia, unspecified hyperlipidemia type - phentermine (ADIPEX-P) 37.5 MG tablet; Take 1 tablet (37.5 mg total) by mouth daily before breakfast.  Dispense: 30 tablet; Refill: 2  4. Weight loss counseling, encounter for - phentermine (ADIPEX-P) 37.5 MG tablet; Take 1 tablet (37.5 mg total) by mouth daily before breakfast.   Dispense: 30 tablet; Refill: 2  5. 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  Dispense: 4 tablet; Refill: 5  6. Acne vulgaris Start spironolactone 25 mg  Continue to use Benzaclin gel BID  Keep clean and dry - spironolactone (ALDACTONE) 25 MG tablet; Take 1 tablet (25 mg total) by mouth daily.  Dispense: 90 tablet; Refill: 1   Labs pending Needs to lose 5% of weight to continue medication Health Maintenance reviewed Diet and exercise encouraged  Follow up plan: 3 months     Jannifer Rodney, FNP

## 2022-11-10 NOTE — Patient Instructions (Signed)
Acne  Acne is a skin problem that causes pimples and other skin changes. The skin has many tiny openings called pores. Each pore contains an oil gland. Oil glands make an oily substance called sebum. Acne happens when the pores in the skin get blocked. The pores may get infected with bacteria, or they may become red, sore, and swollen.  Acne is a common skin problem, especially for teens. It often forms on your face, neck, chest, upper arms, and back. Acne usually goes away with time. What are the causes? Acne is caused when oil glands get blocked with sebum, dead skin cells, and dirt. The bacteria that are normally found in the oil glands then increase, causing inflammation. Acne is commonly triggered by changes in your hormones. These hormone changes can cause the oil glands to get bigger and to make more sebum. Factors that can make acne worse include: Hormone changes during: Adolescence. Monthly periods (menstrual cycles). Pregnancy. Oil-based makeup, creams, and hair products. Stress. Hormone problems that are caused by certain diseases. Certain medicines. Pressure from headbands, backpacks, or shoulder pads. Being exposed to certain oils and chemicals. Eating a diet high in carbs (carbohydrates) that quickly turn to sugar. These include dairy products, desserts, and chocolates. What increases the risk? You are more likely to get acne if: You are a teen. You have a family history of acne. What are the signs or symptoms? Symptoms of acne include: Small, red bumps (pimples or papules). Whiteheads. Blackheads. Small, pus-filled pimples (pustules). Big, red pimples or pustules that feel tender. More severe acne can cause: Abscesses. These are infected areas that hold a collection of pus. Cysts. These are hard, painful, fluid-filled sacs. Scars. These can form after large pimples heal. How is this diagnosed? Acne is diagnosed with a medical history and physical exam. You may also  have blood tests. How is this treated? Treatment depends on how severe your acne is. Treatment may include: Creams and lotions that: Keep oil glands from clogging. Treat or prevent infections and inflammation. Antibiotic medicines that are put on the skin or taken as a pill. Pills that lower sebum production. Birth control pills. Light or laser treatments. Medicine injected into areas with acne. Chemicals that cause the skin to peel. Surgery. Your health care provider will also recommend the best way to take care of your skin. Good skin care is the most important part of treatment. Follow these instructions at home: Skin care Take care of your skin as told by your health care provider. You may be told to do these things: Wash your skin gently: At least two times each day. After you exercise and before going to bed. Use mild soap. After you wash your skin, put a water-based lotion on it for moisture. Use a sunscreen or sunblock with SPF 30 or greater, and apply it often. Acne medicines make skin more sensitive to sun. Choose makeup and creams that will not block your oil glands (are noncomedogenic). Medicines Take over-the-counter and prescription medicines only as told by your health care provider. If you were prescribed antibiotics, use them as told by your health care provider. Do not stop using the antibiotic even if your acne improves. General instructions Keep your hair clean and off your face. If you have oily hair, shampoo your hair regularly or daily. Avoid wearing tight headbands or hats. Avoid picking or squeezing your pimples. Picking and squeezing pimples can make acne worse and cause scarring. Shave gently and only when needed. Keep a  food journal to figure out if any foods are linked to your acne. Avoid dairy products, desserts, and chocolates. Take steps to manage and reduce stress. Keep all follow-up visits. Your health care provider needs to watch for changes in  your acne and may need to adjust your treatments. Contact a health care provider if: Your acne is not better after 8 weeks. Your acne gets worse. You have a large area of skin that is red or tender. You think that you are having side effects from any acne medicine. This information is not intended to replace advice given to you by your health care provider. Make sure you discuss any questions you have with your health care provider. Document Revised: 09/01/2021 Document Reviewed: 09/01/2021 Elsevier Patient Education  2024 [employer].

## 2023-02-16 ENCOUNTER — Ambulatory Visit: Payer: BC Managed Care – PPO | Admitting: Family

## 2023-02-19 ENCOUNTER — Encounter: Payer: Self-pay | Admitting: Family

## 2023-02-19 ENCOUNTER — Ambulatory Visit: Payer: BC Managed Care – PPO | Admitting: Family

## 2023-02-19 VITALS — BP 139/88 | HR 80 | Temp 97.4°F | Ht 61.0 in | Wt 186.6 lb

## 2023-02-19 DIAGNOSIS — Z713 Dietary counseling and surveillance: Secondary | ICD-10-CM

## 2023-02-19 DIAGNOSIS — E559 Vitamin D deficiency, unspecified: Secondary | ICD-10-CM

## 2023-02-19 DIAGNOSIS — I159 Secondary hypertension, unspecified: Secondary | ICD-10-CM

## 2023-02-19 DIAGNOSIS — J4521 Mild intermittent asthma with (acute) exacerbation: Secondary | ICD-10-CM | POA: Diagnosis not present

## 2023-02-19 DIAGNOSIS — E785 Hyperlipidemia, unspecified: Secondary | ICD-10-CM

## 2023-02-19 MED ORDER — PHENTERMINE HCL 37.5 MG PO TABS: 37.5000 mg | ORAL_TABLET | Freq: Every day | ORAL | 2 refills | Status: DC

## 2023-02-19 NOTE — Patient Instructions (Signed)

## 2023-02-19 NOTE — Progress Notes (Signed)
Subjective:    Patient ID: Lauren [name], female    DOB: [birth year], 47 y.o.   MRN: [id number]  Chief Complaint  Patient presents with   Medical Management of Chronic Issues   Pt presents to the office today chronic follow up and discuss weight loss. She was seen on 08/10/22 and given phentermine 37.5 mg. She has only been taking 1/2 tab.       02/19/2023    9:41 AM 11/10/2022    9:02 AM 08/10/2022    3:22 PM  Last 3 Weights  Weight (lbs) 186 lb 9.6 oz 189 lb 3.2 oz 185 lb 3.2 oz  Weight (kg) 84.641 kg 85.821 kg 84.006 kg     Asthma There is no cough, shortness of breath or wheezing. This is a chronic problem. The current episode started more than 1 year ago. Her symptoms are alleviated by rest. She reports moderate improvement on treatment. Her past medical history is significant for asthma.  Hypertension This is a chronic problem. The current episode started more than 1 year ago. The problem has been resolved since onset. The problem is controlled. Pertinent negatives include no peripheral edema or shortness of breath. Risk factors for coronary artery disease include dyslipidemia, obesity and sedentary lifestyle. The current treatment provides moderate improvement.  Hyperlipidemia This is a chronic problem. The current episode started more than 1 year ago. Exacerbating diseases include obesity. Pertinent negatives include no shortness of breath. Current antihyperlipidemic treatment includes diet change. The current treatment provides mild improvement of lipids. Risk factors for coronary artery disease include dyslipidemia, hypertension, a sedentary lifestyle and post-menopausal.      Review of Systems  Respiratory:  Negative for cough, shortness of breath and wheezing.   All other systems reviewed and are negative.      Objective:   Physical Exam Vitals reviewed.  Constitutional:      General: She is not in acute distress.    Appearance: She is well-developed. She is obese.   HENT:     Head: Normocephalic and atraumatic.     Right Ear: Tympanic membrane normal.     Left Ear: Tympanic membrane normal.  Eyes:     Pupils: Pupils are equal, round, and reactive to light.  Neck:     Thyroid: No thyromegaly.  Cardiovascular:     Rate and Rhythm: Normal rate and regular rhythm.     Heart sounds: Normal heart sounds. No murmur heard. Pulmonary:     Effort: Pulmonary effort is normal. No respiratory distress.     Breath sounds: Normal breath sounds. No wheezing.  Abdominal:     General: Bowel sounds are normal. There is no distension.     Palpations: Abdomen is soft.     Tenderness: There is no abdominal tenderness.  Musculoskeletal:        General: No tenderness. Normal range of motion.     Cervical back: Normal range of motion and neck supple.  Skin:    General: Skin is warm and dry.  Neurological:     Mental Status: She is alert and oriented to person, place, and time.     Cranial Nerves: No cranial nerve deficit.     Deep Tendon Reflexes: Reflexes are normal and symmetric.  Psychiatric:        Behavior: Behavior normal.        Thought Content: Thought content normal.        Judgment: Judgment normal.      BP 139/88  Pulse 80   Temp (!) 97.4 F (36.3 C) (Temporal)   Ht 5\' 1"  (1.549 m)   Wt 186 lb 9.6 oz (84.6 kg)   LMP 08/18/2012   SpO2 98%   BMI 35.26 kg/m      Assessment & Plan:  Lauren [name] comes in today with chief complaint of Medical Management of Chronic Issues   Diagnosis and orders addressed:  1. Weight loss counseling, encounter for - phentermine (ADIPEX-P) 37.5 MG tablet; Take 1 tablet (37.5 mg total) by mouth daily before breakfast.  Dispense: 30 tablet; Refill: 2 - CMP14+EGFR  2. Secondary hypertension - phentermine (ADIPEX-P) 37.5 MG tablet; Take 1 tablet (37.5 mg total) by mouth daily before breakfast.  Dispense: 30 tablet; Refill: 2 - CMP14+EGFR  3. Morbid obesity (HCC) - phentermine (ADIPEX-P) 37.5 MG tablet;  Take 1 tablet (37.5 mg total) by mouth daily before breakfast.  Dispense: 30 tablet; Refill: 2 - CMP14+EGFR  4. Mild intermittent asthma with acute exacerbation - CMP14+EGFR  5. Vitamin D deficiency - CMP14+EGFR  6. Hyperlipidemia, unspecified hyperlipidemia type - phentermine (ADIPEX-P) 37.5 MG tablet; Take 1 tablet (37.5 mg total) by mouth daily before breakfast.  Dispense: 30 tablet; Refill: 2 - CMP14+EGFR   Labs pending Will start taking phentermine 37.5 mg daily instead of half a tablet  Health Maintenance reviewed Diet and exercise encouraged  Follow up plan: 3 months    Jannifer Rodney, FNP

## 2023-02-20 LAB — CMP14+EGFR
ALT: 19 IU/L (ref 0–32)
AST: 18 [IU]/L (ref 0–40)
Albumin: 4 g/dL (ref 3.9–4.9)
Alkaline Phosphatase: 101 [IU]/L (ref 44–121)
BUN/Creatinine Ratio: 20 (ref 9–23)
BUN: 14 mg/dL (ref 6–24)
Bilirubin Total: 0.3 mg/dL (ref 0.0–1.2)
CO2: 21 mmol/L (ref 20–29)
Calcium: 9.7 mg/dL (ref 8.7–10.2)
Chloride: 104 mmol/L (ref 96–106)
Creatinine, Ser: 0.7 mg/dL (ref 0.57–1.00)
Globulin, Total: 2.9 g/dL (ref 1.5–4.5)
Glucose: 96 mg/dL (ref 70–99)
Potassium: 4.6 mmol/L (ref 3.5–5.2)
Sodium: 137 mmol/L (ref 134–144)
Total Protein: 6.9 g/dL (ref 6.0–8.5)
eGFR: 107 mL/min/{1.73_m2} (ref >59)

## 2023-02-21 ENCOUNTER — Other Ambulatory Visit: Payer: Self-pay | Admitting: Family

## 2023-02-21 DIAGNOSIS — E559 Vitamin D deficiency, unspecified: Secondary | ICD-10-CM

## 2023-04-14 IMAGING — MG DIGITAL DIAGNOSTIC BILAT W/ TOMO W/ CAD
8 of 14 series · 8 of 40 positions shown · non-contrast
Comparison: 06/21/2020, 12/22/2019, and 12/03/2019

CLINICAL DATA: One year follow-up for probably benign mass in the
LEFT breast.

EXAM:
DIGITAL DIAGNOSTIC BILATERAL MAMMOGRAM WITH TOMOSYNTHESIS AND CAD;
ULTRASOUND LEFT BREAST LIMITED
TECHNIQUE: Bilateral digital diagnostic mammography and breast tomosynthesis
was performed. The images were evaluated with computer-aided
detection.; Targeted ultrasound examination of the left breast was
performed.

[L MLO synth-2D]
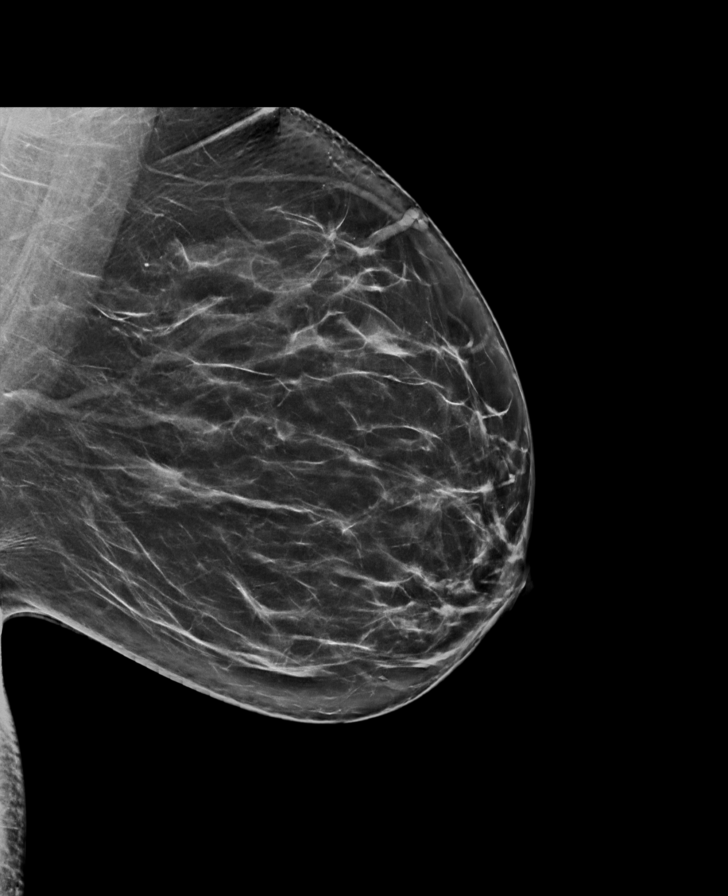

[R CC synth-2D (1 of 3)]
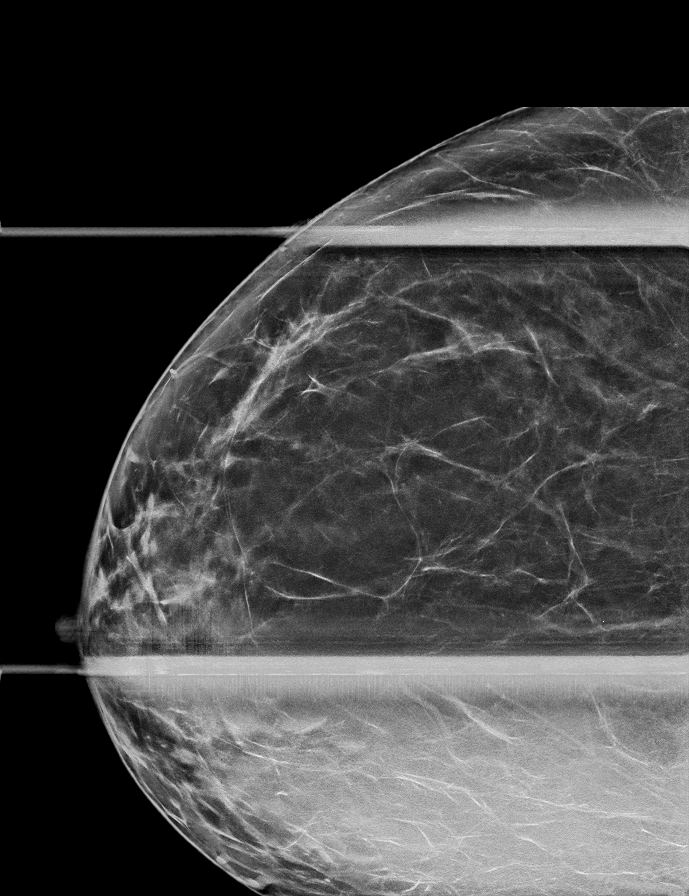

[R CC synth-2D (2 of 3)]
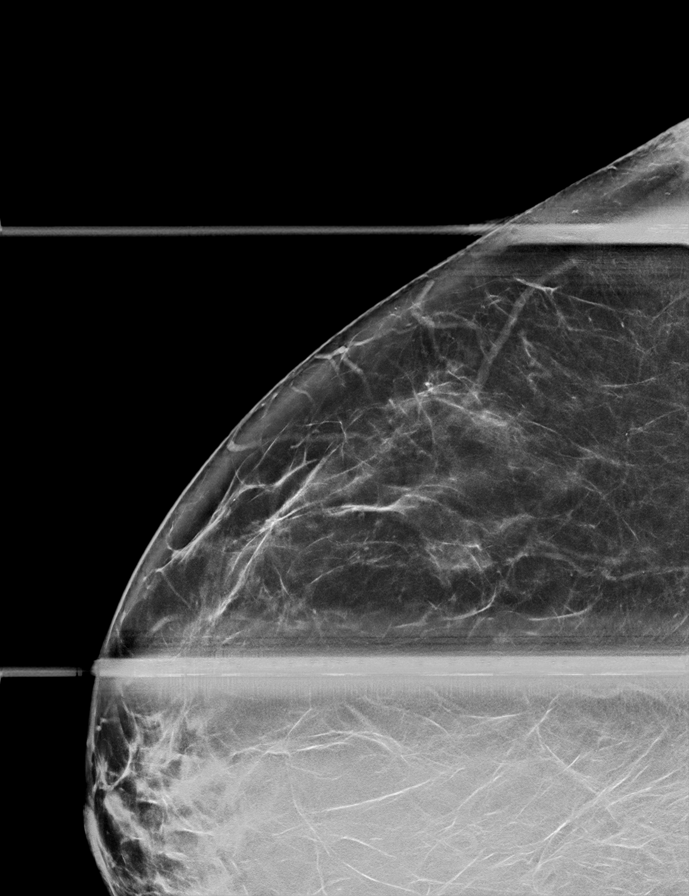

[R MLO synth-2D (1 of 2)]
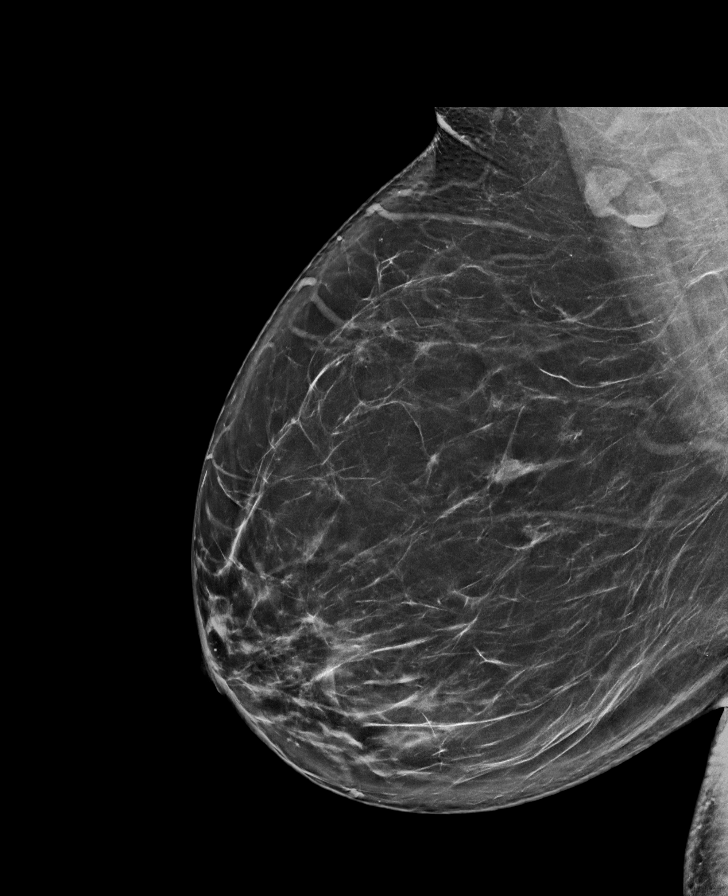

[R MLO synth-2D (2 of 2)]
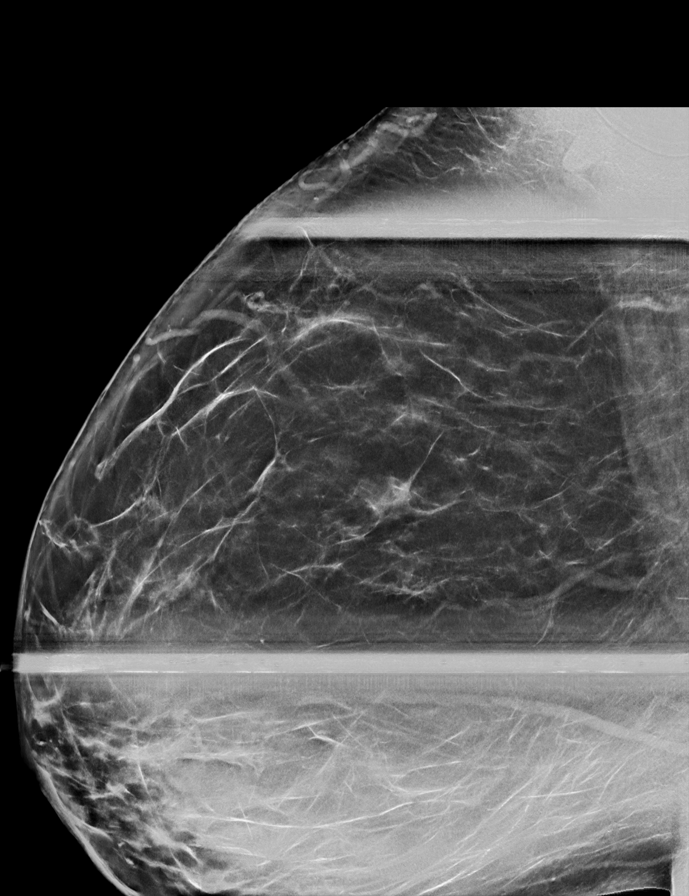

[R CC synth-2D (3 of 3)]
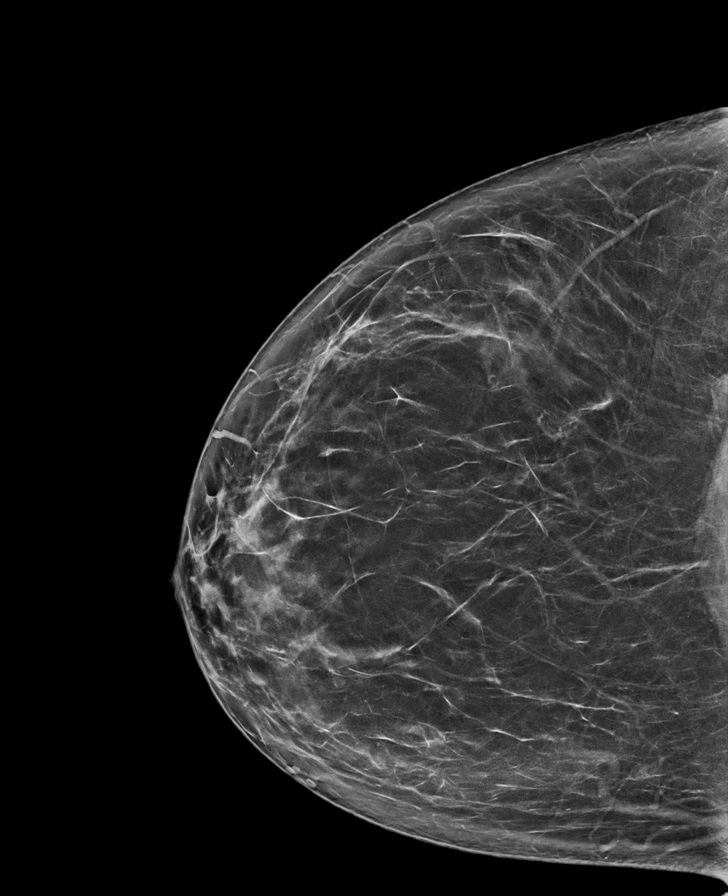

[L CC synth-2D]
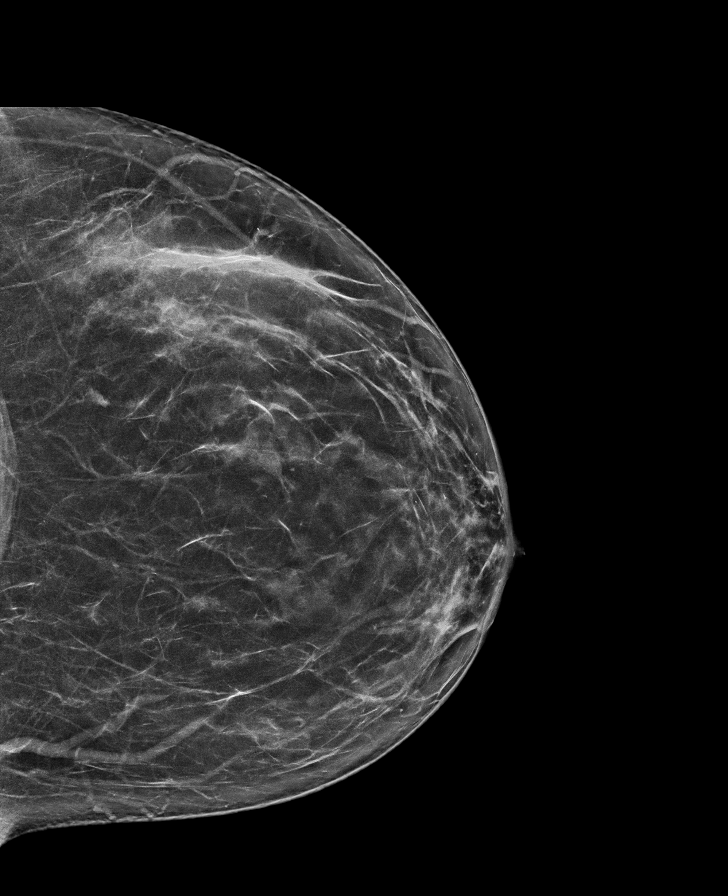

[L MLO tomo · tomo slice 45/88.0]
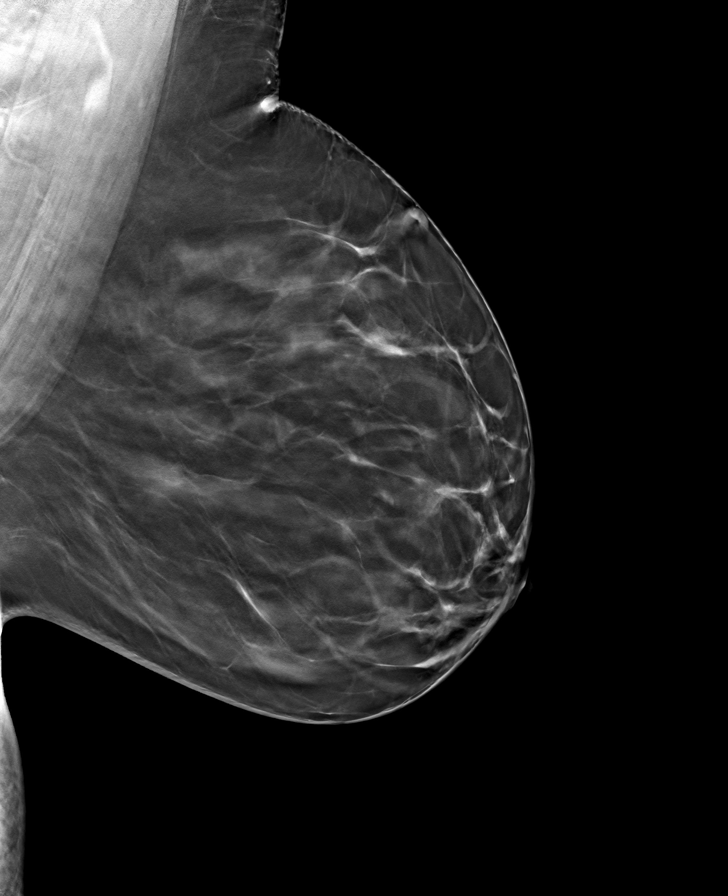

[8 of 40 positions shown; findings below may reference images not displayed]

ACR Breast Density Category b: There are scattered areas of
fibroglandular density.
FINDINGS: RIGHT BREAST:

Mammogram: Possible asymmetry in the central portion of the RIGHT
breast is further evaluated with spot compression views. These views
show no persistent asymmetry and overlapping fibroglandular tissue.
No suspicious mass, distortion, or microcalcifications are
identified to suggest presence of malignancy. Mammographic images
were processed with CAD.

LEFT BREAST:

Mammogram: Circumscribed oval isodense mass in the LOWER OUTER
QUADRANT of the LEFT breast appears stable mammographically. Mass
measures approximately 7 millimeters and is within an encapsulated
circumscribed oval hypodense mass, consistent with
hamartoma/fibroadenolipoma measuring approximately 3.1 centimeters.
Mammographic images were processed with CAD.

Ultrasound: Targeted ultrasound is performed, showing a
circumscribed oval hypoechoic parallel mass in the 3:30 o'clock
location of the LEFT breast 8 centimeters from nipple measuring
x 0.9 x 2.5 centimeters. Within this mass, there is a focal
hyperechoic mass measuring 0.8 x 0.2 x 0.7 centimeters. The
appearance is stable from the prior study.
IMPRESSION: 1.  No mammographic or ultrasound evidence for malignancy.
2. Probable benign hamartoma/fibroadenolipoma in the 3:30 o'clock
location of the LEFT breast.

RECOMMENDATION:
Recommend bilateral diagnostic mammogram with possible LEFT breast
ultrasound in 1 year to complete follow-up.

I have discussed the findings and recommendations with the patient.
If applicable, a reminder letter will be sent to the patient
regarding the next appointment.

BI-RADS CATEGORY  3: Probably benign.

## 2023-05-05 ENCOUNTER — Other Ambulatory Visit: Payer: Self-pay | Admitting: Family

## 2023-05-05 DIAGNOSIS — L700 Acne vulgaris: Secondary | ICD-10-CM

## 2023-05-22 ENCOUNTER — Encounter: Payer: Self-pay | Admitting: Family

## 2023-05-22 ENCOUNTER — Telehealth (INDEPENDENT_AMBULATORY_CARE_PROVIDER_SITE_OTHER): Payer: 59 | Admitting: Family

## 2023-05-22 VITALS — Wt 192.6 lb

## 2023-05-22 DIAGNOSIS — Z713 Dietary counseling and surveillance: Secondary | ICD-10-CM

## 2023-05-22 DIAGNOSIS — E785 Hyperlipidemia, unspecified: Secondary | ICD-10-CM

## 2023-05-22 DIAGNOSIS — E559 Vitamin D deficiency, unspecified: Secondary | ICD-10-CM | POA: Diagnosis not present

## 2023-05-22 DIAGNOSIS — L700 Acne vulgaris: Secondary | ICD-10-CM

## 2023-05-22 DIAGNOSIS — I159 Secondary hypertension, unspecified: Secondary | ICD-10-CM

## 2023-05-22 DIAGNOSIS — J4521 Mild intermittent asthma with (acute) exacerbation: Secondary | ICD-10-CM

## 2023-05-22 MED ORDER — DOXYCYCLINE HYCLATE 100 MG PO TABS
100.0000 mg | ORAL_TABLET | Freq: Two times a day (BID) | ORAL | 0 refills | Status: DC
Start: 2023-05-22 — End: 2023-10-23

## 2023-05-22 MED ORDER — PHENTERMINE HCL 37.5 MG PO TABS
37.5000 mg | ORAL_TABLET | Freq: Every day | ORAL | 2 refills | Status: DC
Start: 2023-05-22 — End: 2023-10-23

## 2023-05-22 NOTE — Progress Notes (Signed)
Virtual Visit Consent   [facility] Ped, you are scheduled for a virtual visit with a [facility] provider today. Just as with appointments in the office, your consent must be obtained to participate. Your consent will be active for this visit and any virtual visit you may have with one of our providers in the next 365 days. If you have a MyChart account, a copy of this consent can be sent to you electronically.  As this is a virtual visit, video technology does not allow for your provider to perform a traditional examination. This may limit your provider's ability to fully assess your condition. If your provider identifies any concerns that need to be evaluated in person or the need to arrange testing (such as labs, EKG, etc.), we will make arrangements to do so. Although advances in technology are sophisticated, we cannot ensure that it will always work on either your end or our end. If the connection with a video visit is poor, the visit may have to be switched to a telephone visit. With either a video or telephone visit, we are not always able to ensure that we have a secure connection.  By engaging in this virtual visit, you consent to the provision of healthcare and authorize for your insurance to be billed (if applicable) for the services provided during this visit. Depending on your insurance coverage, you may receive a charge related to this service.  I need to obtain your verbal consent now. Are you willing to proceed with your visit today? Lauren [name] has provided verbal consent on 05/22/2023 for a virtual visit (video or telephone). Jannifer Rodney, FNP  Date: 05/22/2023 10:21 AM   Virtual Visit via Video Note   I, Jannifer Rodney, connected with  Lauren [name]  ([id number], 49/01/1976) on 05/22/23 at  9:25 AM EST by a video-enabled telemedicine application and verified that I am speaking with the correct person using two identifiers.  Location: Patient: Virtual Visit Location Patient:  Home Provider: Virtual Visit Location Provider: Home Office   I discussed the limitations of evaluation and management by telemedicine and the availability of in person appointments. The patient expressed understanding and agreed to proceed.    History of Present Illness: Lauren [name] is a 48 y.o. who identifies as a female who was assigned female at birth, and is being seen today for chronic follow up and discuss weight loss. She has been taking phentermine 37.5 mg. However, could not refill the prescription for the last two months. She reports she got down to 182 lb, but gained most of it back in the two months she has not had medication.      05/22/2023    9:58 AM 02/19/2023    9:41 AM 11/10/2022    9:02 AM  Last 3 Weights  Weight (lbs) 192 lb 9.6 oz 186 lb 9.6 oz 189 lb 3.2 oz  Weight (kg) 87.363 kg 84.641 kg 85.821 kg    Complaining of worsening acne on her forehead and chin that comes and goes.   HPI: Hypertension This is a chronic problem. The current episode started more than 1 year ago. The problem has been resolved since onset. The problem is controlled. Associated symptoms include shortness of breath. Pertinent negatives include no malaise/fatigue or peripheral edema. The current treatment provides moderate improvement.  Asthma She complains of cough, shortness of breath and wheezing. This is a chronic problem. The current episode started more than 1 year ago. The problem occurs intermittently.  Pertinent negatives include no malaise/fatigue. Her symptoms are alleviated by rest. Her past medical history is significant for asthma.  Hyperlipidemia This is a chronic problem. The current episode started more than 1 year ago. Exacerbating diseases include obesity. Associated symptoms include shortness of breath. Current antihyperlipidemic treatment includes diet change. The current treatment provides mild improvement of lipids. Risk factors for coronary artery disease include  dyslipidemia, hypertension and a sedentary lifestyle.    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   Morbid obesity (HCC) 02/26/2018   Asthma 09/25/2017   Hyperlipidemia 05/15/2014   Vitamin D deficiency 05/15/2014    Allergies:  Allergies  Allergen Reactions   Sulfa Antibiotics Other (See Comments)    Unknown   Medications:  Current Outpatient Medications:    doxycycline (VIBRA-TABS) 100 MG tablet, Take 1 tablet (100 mg total) by mouth 2 (two) times daily., Disp: 120 tablet, Rfl: 0   albuterol (PROVENTIL HFA) 108 (90 Base) MCG/ACT inhaler, Inhale 2 puffs into the lungs every 6 (six) hours as needed for wheezing or shortness of breath., Disp: 8 g, Rfl: 3   Ascorbic Acid (VITAMIN C PO), Take by mouth., Disp: , Rfl:    clindamycin-benzoyl peroxide (BENZACLIN) gel, Apply topically 2 (two) times daily., Disp: 50 g, Rfl: 3   Cyanocobalamin (B-12 PO), Take by mouth., Disp: , Rfl:    FOLIC ACID PO, Take by mouth., Disp: , Rfl:    Omega-3 Fatty Acids (FISH OIL) 300 MG CAPS, Take by mouth., Disp: , Rfl:    phentermine (ADIPEX-P) 37.5 MG tablet, Take 1 tablet (37.5 mg total) by mouth daily before breakfast., Disp: 30 tablet, Rfl: 2   spironolactone (ALDACTONE) 25 MG tablet, TAKE 1 TABLET (25 MG TOTAL) BY MOUTH DAILY., Disp: 90 tablet, Rfl: 0   valACYclovir (VALTREX) 1000 MG tablet, Take 2 tablets (2,000 mg total) by mouth 2 (two) times daily., Disp: 4 tablet, Rfl: 5   Vitamin D, Ergocalciferol, (DRISDOL) 1.25 MG (50000 UNIT) CAPS capsule, TAKE 1 CAPSULE (50,000 UNITS TOTAL) BY MOUTH EVERY 7 (SEVEN) DAYS, Disp: 12 capsule, Rfl: 1  Observations/Objective: Patient is well-developed, well-nourished in no acute distress.  Resting comfortably at home.  Head is normocephalic, atraumatic.  No labored breathing. Speech is clear and coherent with logical content.  Patient is  alert and oriented at baseline.  Acne on forehead and chin  Assessment and Plan: 1. Mild intermittent asthma with acute exacerbation (Primary)  2. Hyperlipidemia, unspecified hyperlipidemia type - phentermine (ADIPEX-P) 37.5 MG tablet; Take 1 tablet (37.5 mg total) by mouth daily before breakfast.  Dispense: 30 tablet; Refill: 2  3. Vitamin D deficiency  4. Morbid obesity (HCC) - phentermine (ADIPEX-P) 37.5 MG tablet; Take 1 tablet (37.5 mg total) by mouth daily before breakfast.  Dispense: 30 tablet; Refill: 2  5. Secondary hypertension - phentermine (ADIPEX-P) 37.5 MG tablet; Take 1 tablet (37.5 mg total) by mouth daily before breakfast.  Dispense: 30 tablet; Refill: 2  6. Weight loss counseling, encounter for - phentermine (ADIPEX-P) 37.5 MG tablet; Take 1 tablet (37.5 mg total) by mouth daily before breakfast.  Dispense: 30 tablet; Refill: 2  7. Acne vulgaris - doxycycline (VIBRA-TABS) 100 MG tablet; Take 1 tablet (100 mg total) by mouth 2 (two) times daily.  Dispense: 120 tablet; Refill: 0  Will refill phentermine today Encourage healthy diet and exercise  Will give doxycyline for acne  today to help with inflammation Follow up in 3 months   Follow Up Instructions: I discussed the assessment and treatment plan with the patient. The patient was provided an opportunity to ask questions and all were answered. The patient agreed with the plan and demonstrated an understanding of the instructions.  A copy of instructions were sent to the patient via MyChart unless otherwise noted below.     The patient was advised to call back or seek an in-person evaluation if the symptoms worsen or if the condition fails to improve as anticipated.    Jannifer Rodney, FNP

## 2023-06-16 ENCOUNTER — Other Ambulatory Visit: Payer: Self-pay | Admitting: Family

## 2023-06-16 DIAGNOSIS — L700 Acne vulgaris: Secondary | ICD-10-CM

## 2023-07-09 ENCOUNTER — Other Ambulatory Visit: Payer: Self-pay | Admitting: Family

## 2023-07-09 DIAGNOSIS — Z1231 Encounter for screening mammogram for malignant neoplasm of breast: Secondary | ICD-10-CM

## 2023-07-11 ENCOUNTER — Ambulatory Visit
Admission: RE | Admit: 2023-07-11 | Discharge: 2023-07-11 | Disposition: A | Discharge status: Home / Self Care | Payer: Self-pay | Source: Ambulatory Visit | Attending: Family | Admitting: Family

## 2023-07-11 DIAGNOSIS — Z1231 Encounter for screening mammogram for malignant neoplasm of breast: Secondary | ICD-10-CM

## 2023-08-06 ENCOUNTER — Encounter: Payer: Self-pay | Admitting: Family Medicine

## 2023-08-14 ENCOUNTER — Other Ambulatory Visit: Payer: Self-pay | Admitting: Family

## 2023-08-14 DIAGNOSIS — L700 Acne vulgaris: Secondary | ICD-10-CM

## 2023-08-22 ENCOUNTER — Other Ambulatory Visit: Payer: Self-pay | Admitting: Family

## 2023-08-22 DIAGNOSIS — E559 Vitamin D deficiency, unspecified: Secondary | ICD-10-CM

## 2023-08-22 NOTE — Telephone Encounter (Signed)
 Last OV 02/19/2023. Last vit D lab 04/2022 ok to refill?

## 2023-09-20 ENCOUNTER — Ambulatory Visit: Payer: Self-pay

## 2023-09-20 ENCOUNTER — Encounter: Payer: Self-pay | Admitting: Nurse Practitioner

## 2023-09-20 ENCOUNTER — Ambulatory Visit: Admitting: Nurse Practitioner

## 2023-09-20 DIAGNOSIS — J4521 Mild intermittent asthma with (acute) exacerbation: Secondary | ICD-10-CM

## 2023-09-20 MED ORDER — ALBUTEROL SULFATE HFA 108 (90 BASE) MCG/ACT IN AERS: 2.0000 | INHALATION_SPRAY | Freq: Four times a day (QID) | RESPIRATORY_TRACT | 3 refills | Status: AC | PRN

## 2023-09-20 MED ORDER — HYDROCODONE BIT-HOMATROP MBR 5-1.5 MG/5ML PO SOLN: 5.0000 mL | Freq: Three times a day (TID) | ORAL | 0 refills | Status: DC | PRN

## 2023-09-20 MED ORDER — PREDNISONE 20 MG PO TABS
ORAL_TABLET | ORAL | 0 refills | Status: DC
Start: 2023-09-20 — End: 2023-10-23

## 2023-09-20 MED ORDER — METHYLPREDNISOLONE ACETATE 80 MG/ML IJ SUSP
80.0000 mg | Freq: Once | INTRAMUSCULAR | Status: AC
  Administered 2023-09-20: 80 mg via INTRAMUSCULAR

## 2023-09-20 NOTE — Patient Instructions (Addendum)

## 2023-09-20 NOTE — Telephone Encounter (Signed)
 Appt made today with DOD.

## 2023-09-20 NOTE — Progress Notes (Signed)
 Subjective:    Patient ID: Lauren [name], female    DOB: 1975/05/23, 48 y.o.   MRN: [id number]   Chief Complaint: URI  Patient went to urgent care in [place] on Saturday  morning. Was having congestion and cough with SOB. Has history of asthma. SHe was given amoxicillin and prednisone . Was dx with bronchitis. Says she is no better. Has been coughing up some blood. Has neb machine at home but has no meds.      Patient Active Problem List   Diagnosis Date Noted   Weight loss counseling, encounter for 11/10/2022   Hypertension 06/02/2021   Oral herpes 10/14/2019   Morbid obesity (HCC) 02/26/2018   Asthma 09/25/2017   Hyperlipidemia 05/15/2014   Vitamin D  deficiency 05/15/2014       Review of Systems  Constitutional:  Negative for chills and fever.  HENT:  Positive for congestion.   Respiratory:  Positive for cough, shortness of breath and wheezing.   Cardiovascular:  Negative for chest pain and leg swelling.       Objective:   Physical Exam Constitutional:      Appearance: Normal appearance. She is obese.   Cardiovascular:     Rate and Rhythm: Normal rate and regular rhythm.     Heart sounds: Normal heart sounds.  Pulmonary:     Breath sounds: Wheezing (exp wheezes throughout) present.     Comments: Deep dry cough   Skin:    General: Skin is warm.   Neurological:     General: No focal deficit present.     Mental Status: She is alert and oriented to person, place, and time.   Psychiatric:        Mood and Affect: Mood normal.        Behavior: Behavior normal.     BP 133/80   Pulse 87   Temp (!) 97.5 F (36.4 C) (Temporal)   Ht 5' 1 (1.549 m)   Wt 199 lb (90.3 kg)   LMP 08/18/2012   SpO2 94%   BMI 37.60 kg/m        Assessment & Plan:   Lauren [name] in today with chief complaint of Cough (Blood tinged/Seen at Urgent Care and they gave amoxicillin and 3 days of prednisone /)   1. Mild intermittent asthma with acute exacerbation 1. Take meds  as prescribed 2. Use a cool mist humidifier especially during the winter months and when heat has been humid. 3. Use saline nose sprays frequently 4. Saline irrigations of the nose can be very helpful if done frequently.  * 4X daily for 1 week*  * Use of a nettie pot can be helpful with this. Follow directions with this* 5. Drink plenty of fluids 6. Keep thermostat turn down low 7.For any cough or congestion- hycodan 8. For fever or aces or pains- take tylenol or ibuprofen appropriate for age and weight.  * for fevers greater than 101 orally you may alternate ibuprofen and tylenol every  3 hours.    - methylPREDNISolone  acetate (DEPO-MEDROL ) injection 80 mg - predniSONE  (DELTASONE ) 20 MG tablet; 2 po at sametime daily for 5 days-  Dispense: 10 tablet; Refill: 0 - HYDROcodone bit-homatropine (HYCODAN) 5-1.5 MG/5ML syrup; Take 5 mLs by mouth every 8 (eight) hours as needed for cough.  Dispense: 120 mL; Refill: 0 - albuterol  (PROVENTIL  HFA) 108 (90 Base) MCG/ACT inhaler; Inhale 2 puffs into the lungs every 6 (six) hours as needed for wheezing or shortness of breath.  Dispense:  8 g; Refill: 3    The above assessment and management plan was discussed with the patient. The patient verbalized understanding of and has agreed to the management plan. Patient is aware to call the clinic if symptoms persist or worsen. Patient is aware when to return to the clinic for a follow-up visit. Patient educated on when it is appropriate to go to the emergency department.   Mary-Margaret Gaylyn Keas, FNP

## 2023-09-20 NOTE — Telephone Encounter (Signed)
 FYI Only or Action Required?: Action required by provider  Patient was last seen in primary care on 05/22/2023 by Yevette Hem, FNP. Called Nurse Triage reporting Cough. Symptoms began several days ago. Interventions attempted: Prescription medications: antibiotic and prednisone . Symptoms are: unchanged.  Triage Disposition: See HCP Within 4 Hours (Or PCP Triage)  Patient/caregiver understands and will follow disposition?: Yes      Copied from CRM [id number]. Topic: Clinical - Red Word Triage >> Sep 20, 2023  8:16 AM Karole Pacer C wrote: Red Word that prompted transfer to Nurse Triage: Patient states she has been coughing up blood & phlegm, she is having some chest pain Reason for Disposition  [1] Coughed up blood AND [2] > 1 tablespoon (15 ml)  Answer Assessment - Initial Assessment Questions 1. ONSET: When did the cough begin?      Cough started 6 days ago  2. SEVERITY: How bad is the cough today? Did the blood appear after a coughing spell?      Not getting better  3. SPUTUM: Describe the color of your sputum (none, dry cough; clear, white, yellow, green)     Green  4. HEMOPTYSIS: How much blood? (flecks, streaks, tablespoons, etc.)     Streaks and globs of blood  5. DIFFICULTY BREATHING: Are you having difficulty breathing? If Yes, ask: How bad is it? (e.g., mild, moderate, severe)    - MILD: No SOB at rest, mild SOB with walking, speaks normally in sentences, can lie down, no retractions, pulse < 100.    - MODERATE: SOB at rest, SOB with minimal exertion and prefers to sit, cannot lie down flat, speaks in phrases, mild retractions, audible wheezing, pulse 100-120.    - SEVERE: Very SOB at rest, speaks in single words, struggling to breathe, sitting hunched forward, retractions, pulse > 120      Mild-moderate shortness of breath  6. FEVER: Do you have a fever? If Yes, ask: What is your temperature, how was it measured, and when did it start?     No  7.  CARDIAC HISTORY: Do you have any history of heart disease? (e.g., heart attack, congestive heart failure)      No  8. LUNG HISTORY: Do you have any history of lung disease?  (e.g., pulmonary embolus, asthma, emphysema)     Asthma  9. PE RISK FACTORS: Do you have a history of blood clots? (or: recent major surgery, recent prolonged travel, bedridden)     No 10. OTHER SYMPTOMS: Do you have any other symptoms? (e.g., runny nose, wheezing, chest pain)       Chest tightness, wheezing  11. PREGNANCY: Is there any chance you are pregnant? When was your last menstrual period?       LMP-unknown due to history  12. TRAVEL: Have you traveled out of the country in the last month? (e.g., travel history, exposures)       No   Seen in UC on Saturday, 5 days ago. Given antibiotic and prednisone . Says that her cough getting better.  Protocols used: Coughing Up Blood-A-AH

## 2023-10-23 ENCOUNTER — Other Ambulatory Visit (HOSPITAL_COMMUNITY)
Admission: RE | Admit: 2023-10-23 | Discharge: 2023-10-23 | Disposition: A | Discharge status: Home / Self Care | Source: Ambulatory Visit | Attending: Family | Admitting: Family

## 2023-10-23 ENCOUNTER — Ambulatory Visit: Admitting: Family

## 2023-10-23 ENCOUNTER — Encounter: Payer: Self-pay | Admitting: Family

## 2023-10-23 VITALS — BP 137/83 | HR 75 | Temp 97.8°F | Ht 61.0 in | Wt 202.6 lb

## 2023-10-23 DIAGNOSIS — Z01419 Encounter for gynecological examination (general) (routine) without abnormal findings: Secondary | ICD-10-CM | POA: Diagnosis present

## 2023-10-23 DIAGNOSIS — Z0000 Encounter for general adult medical examination without abnormal findings: Secondary | ICD-10-CM

## 2023-10-23 DIAGNOSIS — I159 Secondary hypertension, unspecified: Secondary | ICD-10-CM | POA: Diagnosis not present

## 2023-10-23 DIAGNOSIS — R232 Flushing: Secondary | ICD-10-CM

## 2023-10-23 DIAGNOSIS — Z01411 Encounter for gynecological examination (general) (routine) with abnormal findings: Secondary | ICD-10-CM

## 2023-10-23 DIAGNOSIS — E785 Hyperlipidemia, unspecified: Secondary | ICD-10-CM | POA: Diagnosis not present

## 2023-10-23 DIAGNOSIS — Z713 Dietary counseling and surveillance: Secondary | ICD-10-CM | POA: Diagnosis not present

## 2023-10-23 DIAGNOSIS — L700 Acne vulgaris: Secondary | ICD-10-CM

## 2023-10-23 DIAGNOSIS — J4521 Mild intermittent asthma with (acute) exacerbation: Secondary | ICD-10-CM

## 2023-10-23 DIAGNOSIS — E559 Vitamin D deficiency, unspecified: Secondary | ICD-10-CM

## 2023-10-23 DIAGNOSIS — Z90711 Acquired absence of uterus with remaining cervical stump: Secondary | ICD-10-CM

## 2023-10-23 MED ORDER — PHENTERMINE HCL 37.5 MG PO TABS: 37.5000 mg | ORAL_TABLET | Freq: Every day | ORAL | 2 refills | Status: DC

## 2023-10-23 NOTE — Progress Notes (Signed)
 Subjective:    Patient ID: Lauren [name], female    DOB: Aug 18, 1975, 48 y.o.   MRN: [id number]  Chief Complaint  Patient presents with   Annual Exam   Pt presents to the office today CPE with pap.       10/23/2023    2:13 PM 09/20/2023   11:12 AM 05/22/2023    9:58 AM  Last 3 Weights  Weight (lbs) 202 lb 9.6 oz 199 lb 192 lb 9.6 oz  Weight (kg) 91.899 kg 90.266 kg 87.363 kg    She complaining of hot flashes and fatigue. She had partial hysterectomy around age 77.   Asthma She complains of shortness of breath (feeling better) and wheezing. There is no cough. This is a chronic problem. The current episode started more than 1 year ago. Pertinent negatives include no malaise/fatigue. Her symptoms are alleviated by rest. She reports moderate improvement on treatment. Her past medical history is significant for asthma.  Hypertension This is a chronic problem. The current episode started more than 1 year ago. The problem has been resolved since onset. The problem is controlled. Associated symptoms include shortness of breath (feeling better). Pertinent negatives include no malaise/fatigue or peripheral edema. Risk factors for coronary artery disease include dyslipidemia, obesity and sedentary lifestyle. The current treatment provides moderate improvement.  Hyperlipidemia This is a chronic problem. The current episode started more than 1 year ago. The problem is uncontrolled. Recent lipid tests were reviewed and are high. Exacerbating diseases include obesity. Associated symptoms include shortness of breath (feeling better). Current antihyperlipidemic treatment includes diet change. The current treatment provides mild improvement of lipids. Risk factors for coronary artery disease include dyslipidemia, hypertension, a sedentary lifestyle and post-menopausal.      Review of Systems  Constitutional:  Negative for malaise/fatigue.  Respiratory:  Positive for shortness of breath (feeling better)  and wheezing. Negative for cough.   All other systems reviewed and are negative.  Family History  Problem Relation Age of Onset   Breast cancer Mother 70   Heart disease Father    Diabetes Father    Social History   Socioeconomic History   Marital status: Married    Spouse name: Not on file   Number of children: Not on file   Years of education: Not on file   Highest education level: Not on file  Occupational History   Not on file  Tobacco Use   Smoking status: Never   Smokeless tobacco: Never  Vaping Use   Vaping status: Never Used  Substance and Sexual Activity   Alcohol use: No   Drug use: No   Sexual activity: Yes    Birth control/protection: Surgical  Other Topics Concern   Not on file  Social History Narrative   Not on file   Social Drivers of Health   Financial Resource Strain: Not on file  Food Insecurity: Not on file  Transportation Needs: Not on file  Physical Activity: Not on file  Stress: Not on file  Social Connections: Not on file       Objective:   Physical Exam Vitals reviewed.  Constitutional:      General: She is not in acute distress.    Appearance: She is well-developed. She is obese.  HENT:     Head: Normocephalic and atraumatic.     Right Ear: Tympanic membrane normal.     Left Ear: Tympanic membrane normal.  Eyes:     Pupils: Pupils are equal, round, and reactive  to light.  Neck:     Thyroid : No thyromegaly.  Cardiovascular:     Rate and Rhythm: Normal rate and regular rhythm.     Heart sounds: Normal heart sounds. No murmur heard. Pulmonary:     Effort: Pulmonary effort is normal. No respiratory distress.     Breath sounds: Normal breath sounds. No wheezing.  Chest:  Breasts:    Right: No swelling, bleeding, inverted nipple, mass, nipple discharge, skin change or tenderness.     Left: No swelling, bleeding, inverted nipple, mass, nipple discharge, skin change or tenderness.  Abdominal:     General: Bowel sounds are normal.  There is no distension.     Palpations: Abdomen is soft.     Tenderness: There is no abdominal tenderness.  Genitourinary:    Comments: Bimanual exam- no adnexal masses or tenderness, ovaries nonpalpable   Cervix parous and pink- No discharge' Musculoskeletal:        General: No tenderness. Normal range of motion.     Cervical back: Normal range of motion and neck supple.  Skin:    General: Skin is warm and dry.  Neurological:     Mental Status: She is alert and oriented to person, place, and time.     Cranial Nerves: No cranial nerve deficit.     Deep Tendon Reflexes: Reflexes are normal and symmetric.  Psychiatric:        Behavior: Behavior normal.        Thought Content: Thought content normal.        Judgment: Judgment normal.      BP 137/83   Pulse 75   Temp 97.8 F (36.6 C) (Temporal)   Ht 5' 1 (1.549 m)   Wt 202 lb 9.6 oz (91.9 kg)   LMP 08/18/2012   BMI 38.28 kg/m      Assessment & Plan:  Lauren [name] comes in today with chief complaint of Annual Exam   Diagnosis and orders addressed:  1. Annual physical exam (Primary) - CMP14+EGFR - CBC with Differential/Platelet - Lipid panel - Hormone Panel  2. Gynecologic exam normal - CMP14+EGFR - CBC with Differential/Platelet - Cytology - PAP([place])  3. Hyperlipidemia, unspecified hyperlipidemia type - CMP14+EGFR - CBC with Differential/Platelet - phentermine  (ADIPEX-P ) 37.5 MG tablet; Take 1 tablet (37.5 mg total) by mouth daily before breakfast.  Dispense: 30 tablet; Refill: 2  4. Weight loss counseling, encounter for - CMP14+EGFR - CBC with Differential/Platelet - phentermine  (ADIPEX-P ) 37.5 MG tablet; Take 1 tablet (37.5 mg total) by mouth daily before breakfast.  Dispense: 30 tablet; Refill: 2  5. Secondary hypertension - CMP14+EGFR - CBC with Differential/Platelet - phentermine  (ADIPEX-P ) 37.5 MG tablet; Take 1 tablet (37.5 mg total) by mouth daily before breakfast.  Dispense: 30 tablet;  Refill: 2  6. Morbid obesity (HCC)  - CMP14+EGFR - CBC with Differential/Platelet - Hormone Panel - phentermine  (ADIPEX-P ) 37.5 MG tablet; Take 1 tablet (37.5 mg total) by mouth daily before breakfast.  Dispense: 30 tablet; Refill: 2  7. Mild intermittent asthma with acute exacerbatio - CMP14+EGFR - CBC with Differential/Platelet  8. Vitamin D  deficiency - CMP14+EGFR - CBC with Differential/Platelet - VITAMIN D  25 Hydroxy (Vit-D Deficiency, Fractures)  9. Acne vulgaris - Hormone Panel  10. Hot flashes - Hormone Panel  11. History of partial hysterectomy - Hormone Panel   Labs pending Will resstart taking phentermine  37.5 mg daily instead of half a tablet  Health Maintenance reviewed Encourage healthy Diet and exercise   Follow  up plan: 3 months    Bari Learn, FNP

## 2023-10-23 NOTE — Patient Instructions (Signed)

## 2023-10-25 ENCOUNTER — Ambulatory Visit: Payer: Self-pay | Admitting: Family

## 2023-10-26 LAB — CYTOLOGY - PAP
Adequacy: ABSENT
Diagnosis: NEGATIVE

## 2023-11-03 LAB — LIPID PANEL
Chol/HDL Ratio: 3.5 ratio (ref 0.0–4.4)
Cholesterol, Total: 214 mg/dL — ABNORMAL HIGH (ref 100–199)
HDL: 62 mg/dL (ref >39)
LDL Chol Calc (NIH): 143 mg/dL — ABNORMAL HIGH (ref 0–99)
Triglycerides: 52 mg/dL (ref 0–149)
VLDL Cholesterol Cal: 9 mg/dL (ref 5–40)

## 2023-11-03 LAB — HORMONE PANEL (T4,TSH,FSH,TESTT,SHBG,DHEA,ETC)
DHEA-Sulfate, LCMS: 22 ug/dL
Estradiol, Serum, MS: 53 pg/mL
Estrone Sulfate: 28 ng/dL
Follicle Stimulating Hormone: 31 m[IU]/mL
Free T-3: 3 pg/mL
Free Testosterone, Serum: 5.5 pg/mL
Progesterone, Serum: <10 ng/dL
Sex Hormone Binding Globulin: 53.8 nmol/L
T4: 9.1 ug/dL
TSH: 1.2 uU/mL
Testosterone, Serum (Total): 39 ng/dL
Testosterone-% Free: 1.4 %
Triiodothyronine (T-3), Serum: 92 ng/dL

## 2023-11-03 LAB — CMP14+EGFR
ALT: 25 IU/L (ref 0–32)
AST: 21 IU/L (ref 0–40)
Albumin: 4.5 g/dL (ref 3.9–4.9)
Alkaline Phosphatase: 133 IU/L — ABNORMAL HIGH (ref 44–121)
BUN/Creatinine Ratio: 21 (ref 9–23)
BUN: 15 mg/dL (ref 6–24)
Bilirubin Total: 0.2 mg/dL (ref 0.0–1.2)
CO2: 19 mmol/L — ABNORMAL LOW (ref 20–29)
Calcium: 10.3 mg/dL — ABNORMAL HIGH (ref 8.7–10.2)
Chloride: 100 mmol/L (ref 96–106)
Creatinine, Ser: 0.7 mg/dL (ref 0.57–1.00)
Globulin, Total: 3 g/dL (ref 1.5–4.5)
Glucose: 81 mg/dL (ref 70–99)
Potassium: 4.5 mmol/L (ref 3.5–5.2)
Sodium: 137 mmol/L (ref 134–144)
Total Protein: 7.5 g/dL (ref 6.0–8.5)
eGFR: 107 mL/min/1.73 (ref >59)

## 2023-11-03 LAB — CBC WITH DIFFERENTIAL/PLATELET
Basophils Absolute: 0.1 x10E3/uL (ref 0.0–0.2)
Basos: 1 %
EOS (ABSOLUTE): 0.1 x10E3/uL (ref 0.0–0.4)
Eos: 1 %
Hematocrit: 45.4 % (ref 34.0–46.6)
Hemoglobin: 14.6 g/dL (ref 11.1–15.9)
Immature Grans (Abs): 0 x10E3/uL (ref 0.0–0.1)
Immature Granulocytes: 0 %
Lymphocytes Absolute: 3 x10E3/uL (ref 0.7–3.1)
Lymphs: 32 %
MCH: 29.4 pg (ref 26.6–33.0)
MCHC: 32.2 g/dL (ref 31.5–35.7)
MCV: 92 fL (ref 79–97)
Monocytes Absolute: 0.6 x10E3/uL (ref 0.1–0.9)
Monocytes: 6 %
Neutrophils Absolute: 5.7 x10E3/uL (ref 1.4–7.0)
Neutrophils: 60 %
Platelets: 317 x10E3/uL (ref 150–450)
RBC: 4.96 x10E6/uL (ref 3.77–5.28)
RDW: 13 % (ref 11.7–15.4)
WBC: 9.5 x10E3/uL (ref 3.4–10.8)

## 2023-11-03 LAB — VITAMIN D 25 HYDROXY (VIT D DEFICIENCY, FRACTURES): Vit D, 25-Hydroxy: 53.6 ng/mL (ref 30.0–100.0)

## 2023-11-21 ENCOUNTER — Other Ambulatory Visit: Payer: Self-pay | Admitting: Family

## 2023-11-21 DIAGNOSIS — L700 Acne vulgaris: Secondary | ICD-10-CM

## 2023-11-22 DIAGNOSIS — R400 Somnolence: Secondary | ICD-10-CM

## 2023-11-22 NOTE — Telephone Encounter (Signed)
 Do not see mention of this in note please advise.

## 2024-01-03 ENCOUNTER — Other Ambulatory Visit: Payer: Self-pay | Admitting: Family

## 2024-01-24 ENCOUNTER — Encounter: Payer: Self-pay | Admitting: Family

## 2024-01-24 ENCOUNTER — Ambulatory Visit (INDEPENDENT_AMBULATORY_CARE_PROVIDER_SITE_OTHER): Admitting: Family

## 2024-01-24 VITALS — BP 114/79 | HR 90 | Temp 97.7°F | Ht 61.0 in | Wt 199.0 lb

## 2024-01-24 DIAGNOSIS — J4521 Mild intermittent asthma with (acute) exacerbation: Secondary | ICD-10-CM

## 2024-01-24 DIAGNOSIS — I159 Secondary hypertension, unspecified: Secondary | ICD-10-CM | POA: Diagnosis not present

## 2024-01-24 DIAGNOSIS — L700 Acne vulgaris: Secondary | ICD-10-CM

## 2024-01-24 DIAGNOSIS — E785 Hyperlipidemia, unspecified: Secondary | ICD-10-CM | POA: Diagnosis not present

## 2024-01-24 DIAGNOSIS — Z713 Dietary counseling and surveillance: Secondary | ICD-10-CM

## 2024-01-24 DIAGNOSIS — Z1211 Encounter for screening for malignant neoplasm of colon: Secondary | ICD-10-CM

## 2024-01-24 DIAGNOSIS — E559 Vitamin D deficiency, unspecified: Secondary | ICD-10-CM

## 2024-01-24 MED ORDER — SPIRONOLACTONE 25 MG PO TABS: 25.0000 mg | ORAL_TABLET | Freq: Every day | ORAL | 0 refills | Status: AC

## 2024-01-24 MED ORDER — PHENTERMINE HCL 37.5 MG PO TABS: 37.5000 mg | ORAL_TABLET | Freq: Every day | ORAL | 2 refills | Status: AC

## 2024-01-24 NOTE — Patient Instructions (Signed)

## 2024-01-24 NOTE — Progress Notes (Signed)
 Subjective:    Patient ID: Lauren [name] [name], female    DOB: [birth year], 48 y.o.   MRN: [id number]  Chief [name]  Patient presents with   Medical Management of Chronic Issues   Pt presents to the office today chronic follow up.        01/24/2024    3:18 PM 10/23/2023    2:13 PM 09/20/2023   11:12 AM  Last 3 Weights  Weight (lbs) 199 lb 202 lb 9.6 oz 199 lb  Weight (kg) 90.266 kg 91.899 kg 90.266 kg    Lauren [name] [name] complaining of hot flashes Lauren [name] fatigue. Lauren [name] [name] had partial hysterectomy around age 27.   Asthma There is no cough, shortness of breath (feeling better) or wheezing. This is a chronic problem. The current episode started more than 1 year ago. Pertinent negatives include no malaise/fatigue. Lauren [name] [name] symptoms are alleviated by rest. Lauren [name] [name] reports moderate improvement on treatment. Lauren [name] [name] past medical history is significant for asthma.  Hypertension This is a chronic problem. The current episode started more than 1 year ago. The problem has been resolved since onset. The problem is controlled. Pertinent negatives include no malaise/fatigue, peripheral edema or shortness of breath (feeling better). Risk factors for coronary artery disease include dyslipidemia, obesity Lauren [name] sedentary lifestyle. The current treatment provides moderate improvement.  Hyperlipidemia This is a chronic problem. The current episode started more than 1 year ago. The problem is uncontrolled. Recent lipid tests were reviewed Lauren [name] are high. Exacerbating diseases include obesity. Pertinent negatives include no shortness of breath (feeling better). Current antihyperlipidemic treatment includes diet change. The current treatment provides mild improvement of lipids. Risk factors for coronary artery disease include dyslipidemia, hypertension, a sedentary lifestyle Lauren [name] post-menopausal.      Review of Systems  Constitutional:  Negative for malaise/fatigue.  Respiratory:  Negative for cough, shortness of breath (feeling better) Lauren [name]  wheezing.   All other systems reviewed Lauren [name] are negative.  Family History  Problem Relation Age of Onset   Breast cancer Mother 18   Heart disease Father    Diabetes Father    Social History   Socioeconomic History   Marital status: Married    Spouse name: Not on file   Number of children: Not on file   Years of education: Not on file   Highest education level: Not on file  Occupational History   Not on file  Tobacco Use   Smoking status: Never   Smokeless tobacco: Never  Vaping Use   Vaping status: Never Used  Substance Lauren [name] Sexual Activity   Alcohol use: No   Drug use: No   Sexual activity: Yes    Birth control/protection: Surgical  Other Topics Concern   Not on file  Social History Narrative   Not on file   Social Drivers of Health   Financial Resource Strain: Not on file  Food Insecurity: Not on file  Transportation Needs: Not on file  Physical Activity: Not on file  Stress: Not on file  Social Connections: Not on file       Objective:   Physical Exam Vitals reviewed.  Constitutional:      General: Lauren [name] [name].    Appearance: Lauren [name] [name]. Lauren [name] [name].  HENT:     Head: Normocephalic Lauren [name] atraumatic.     Right Ear: Tympanic membrane normal.     Left Ear: Tympanic membrane normal.  Eyes:     Pupils: Pupils are equal, round, Lauren [name] reactive to light.  Neck:  Thyroid : No thyromegaly.  Cardiovascular:     Rate Lauren [name] Rhythm: Normal rate Lauren [name] regular rhythm.     Heart sounds: Normal heart sounds. No murmur heard. Pulmonary:     Effort: Pulmonary effort is normal. No respiratory [name].     Breath sounds: Normal breath sounds. No wheezing.  Abdominal:     General: Bowel sounds are normal. There is no distension.     Palpations: Abdomen is soft.     Tenderness: There is no abdominal tenderness.  Musculoskeletal:        General: No tenderness. Normal range of motion.     Cervical back: Normal range of motion Lauren [name] neck supple.   Skin:    General: Skin is warm Lauren [name] dry.  Neurological:     Mental Status: Lauren [name] [name], Lauren [name] [name], Lauren [name] [name].     Cranial Nerves: No cranial nerve deficit.     Deep Tendon Reflexes: Reflexes are normal Lauren [name] symmetric.  Psychiatric:        Behavior: Behavior normal.        Thought Content: Thought content normal.        Judgment: Judgment normal.      BP 114/79   Pulse 90   Temp 97.7 F (36.5 C) (Temporal)   Ht 5' 1 (1.549 m)   Wt 199 lb (90.3 kg)   LMP 08/18/2012   BMI 37.60 kg/m      Assessment & Plan:  KJERSTEN ORMISTON comes in today with chief [name] of Medical Management of Chronic Issues   Diagnosis Lauren [name] orders addressed:  1. Hyperlipidemia, unspecified hyperlipidemia type - phentermine  (ADIPEX-P ) 37.5 MG tablet; Take 1 tablet (37.5 mg total) by mouth daily before breakfast.  Dispense: 30 tablet; Refill: 2 - CMP14+EGFR  2. Weight loss counseling, encounter for (Primary) - phentermine  (ADIPEX-P ) 37.5 MG tablet; Take 1 tablet (37.5 mg total) by mouth daily before breakfast.  Dispense: 30 tablet; Refill: 2 - CMP14+EGFR  3. Secondary hypertension - phentermine  (ADIPEX-P ) 37.5 MG tablet; Take 1 tablet (37.5 mg total) by mouth daily before breakfast.  Dispense: 30 tablet; Refill: 2 - CMP14+EGFR  4. Morbid obesity (HCC) - phentermine  (ADIPEX-P ) 37.5 MG tablet; Take 1 tablet (37.5 mg total) by mouth daily before breakfast.  Dispense: 30 tablet; Refill: 2 - CMP14+EGFR  5. Acne vulgaris - spironolactone  (ALDACTONE ) 25 MG tablet; Take 1 tablet (25 mg total) by mouth daily.  Dispense: 90 tablet; Refill: 0 - CMP14+EGFR  6. Mild intermittent asthma with acute exacerbation - CMP14+EGFR  7. Vitamin D  deficiency - CMP14+EGFR  8. Screen for colon cancer - Cologuard - CMP14+EGFR   Labs pending Will resstart taking phentermine  37.5 mg daily instead of half a tablet  Get sleep study completed  Health Maintenance reviewed Encourage healthy Diet Lauren [name]  exercise   Follow up plan: 3 months    Bari Learn, FNP

## 2024-01-25 ENCOUNTER — Ambulatory Visit: Payer: Self-pay | Admitting: Family

## 2024-01-25 LAB — CMP14+EGFR
ALT: 20 IU/L (ref 0–32)
AST: 17 IU/L (ref 0–40)
Albumin: 4.1 g/dL (ref 3.9–4.9)
Alkaline Phosphatase: 111 IU/L (ref 41–116)
BUN/Creatinine Ratio: 18 (ref 9–23)
BUN: 15 mg/dL (ref 6–24)
Bilirubin Total: <0.2 mg/dL (ref 0.0–1.2)
CO2: 22 mmol/L (ref 20–29)
Calcium: 9.5 mg/dL (ref 8.7–10.2)
Chloride: 103 mmol/L (ref 96–106)
Creatinine, Ser: 0.85 mg/dL (ref 0.57–1.00)
Globulin, Total: 3.1 g/dL (ref 1.5–4.5)
Glucose: 92 mg/dL (ref 70–99)
Potassium: 4.3 mmol/L (ref 3.5–5.2)
Sodium: 138 mmol/L (ref 134–144)
Total Protein: 7.2 g/dL (ref 6.0–8.5)
eGFR: 84 mL/min/1.73 (ref >59)

## 2024-02-06 ENCOUNTER — Other Ambulatory Visit: Payer: Self-pay | Admitting: Family

## 2024-02-06 DIAGNOSIS — E559 Vitamin D deficiency, unspecified: Secondary | ICD-10-CM

## 2024-02-11 ENCOUNTER — Other Ambulatory Visit: Payer: Self-pay | Admitting: *Deleted

## 2024-02-11 DIAGNOSIS — B002 Herpesviral gingivostomatitis and pharyngotonsillitis: Secondary | ICD-10-CM

## 2024-02-15 LAB — COLOGUARD: COLOGUARD: NEGATIVE

## 2024-04-18 ENCOUNTER — Telehealth: Admitting: Physician Assistant

## 2024-04-18 DIAGNOSIS — J4541 Moderate persistent asthma with (acute) exacerbation: Secondary | ICD-10-CM | POA: Diagnosis not present

## 2024-04-18 DIAGNOSIS — J069 Acute upper respiratory infection, unspecified: Secondary | ICD-10-CM | POA: Diagnosis not present

## 2024-04-18 MED ORDER — FLUTICASONE PROPIONATE 50 MCG/ACT NA SUSP: 2.0000 | Freq: Every day | NASAL | 0 refills | Status: AC

## 2024-04-18 MED ORDER — BENZONATATE 100 MG PO CAPS: 100.0000 mg | ORAL_CAPSULE | Freq: Three times a day (TID) | ORAL | 0 refills | Status: AC | PRN

## 2024-04-18 MED ORDER — ALBUTEROL SULFATE (2.5 MG/3ML) 0.083% IN NEBU: 2.5000 mg | INHALATION_SOLUTION | Freq: Four times a day (QID) | RESPIRATORY_TRACT | 0 refills | Status: AC | PRN

## 2024-04-18 NOTE — Progress Notes (Signed)

## 2024-04-29 ENCOUNTER — Encounter: Payer: Self-pay | Admitting: Nurse Practitioner

## 2024-04-29 ENCOUNTER — Ambulatory Visit: Admitting: Nurse Practitioner

## 2024-04-29 VITALS — BP 139/87 | HR 70 | Temp 97.4°F | Ht 61.0 in | Wt 204.0 lb

## 2024-04-29 DIAGNOSIS — R079 Chest pain, unspecified: Secondary | ICD-10-CM

## 2024-04-29 DIAGNOSIS — J4541 Moderate persistent asthma with (acute) exacerbation: Secondary | ICD-10-CM

## 2024-04-29 MED ORDER — PREDNISONE 20 MG PO TABS: 40.0000 mg | ORAL_TABLET | Freq: Every day | ORAL | 0 refills | Status: AC

## 2024-04-29 MED ORDER — BUDESONIDE-FORMOTEROL FUMARATE 80-4.5 MCG/ACT IN AERO: 2.0000 | INHALATION_SPRAY | Freq: Two times a day (BID) | RESPIRATORY_TRACT | 3 refills | Status: AC

## 2024-04-29 NOTE — Progress Notes (Signed)
 "  Subjective:    Patient ID: Lauren [name], female    DOB: Aug 10, 1975, 49 y.o.   MRN: [id number]   Chief Complaint: uri  HPI  Patient has been sick for several weeks. Got better then got bad again. Having SOB and coughing.  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  Respiratory:  Positive for cough, choking and shortness of breath.   Cardiovascular:  Positive for chest pain.       Objective:   Physical Exam Constitutional:      Appearance: Normal appearance.  Cardiovascular:     Rate and Rhythm: Normal rate and regular rhythm.     Heart sounds: Normal heart sounds.  Pulmonary:     Effort: Pulmonary effort is normal.     Breath sounds: Wheezing (exp wheezes throughout) present.     Comments: Deep dry  cough Skin:    General: Skin is warm.  Neurological:     General: No focal deficit present.     Mental Status: She is alert and oriented to person, place, and time.  Psychiatric:        Mood and Affect: Mood normal.        Behavior: Behavior normal.       BP 139/87   Pulse 70   Temp (!) 97.4 F (36.3 C) (Temporal)   Ht 5' 1 (1.549 m)   Wt 204 lb (92.5 kg)   LMP 08/18/2012   SpO2 98%   BMI 38.55 kg/m  EKG normal sinus Ina Lunger, FNP     Assessment & Plan:   Lauren [name] in today with chief complaint of No chief complaint on file.   1. Chest pain, unspecified type (Primary) EKG clea - EKG 12-Lead  2. Moderate persistent asthmatic bronchitis with acute exacerbation 1. Take meds as prescribed 2. Use a cool mist humidifier especially during the winter months and when heat has been  humid. 3. Use saline nose sprays frequently 4. Saline irrigations of the nose can be very helpful if done frequently.  * 4X daily for 1 week*  * Use of a nettie pot can be helpful with this. Follow directions with this* 5. Drink plenty of fluids 6. Keep thermostat turn down low 7.For any cough or congestion- mucinex if needed 8. For fever or aces or pains- take tylenol or ibuprofen appropriate for age and weight.  * for fevers greater than 101 orally you may alternate ibuprofen and tylenol every  3 hours.    - budesonide -formoterol  (SYMBICORT ) 80-4.5 MCG/ACT inhaler; Inhale 2 puffs into the lungs 2 (two) times daily.  Dispense: 1 each; Refill: 3 - predniSONE  (DELTASONE ) 20 MG tablet; Take 2 tablets (40 mg total) by mouth daily with breakfast for 5 days. 2 po daily for 5 days  Dispense: 10 tablet; Refill: 0    The above assessment and management plan was discussed with the patient. The patient verbalized understanding of and has agreed to the management plan. Patient is aware to call the clinic if symptoms persist or worsen. Patient is aware when to return to the clinic for a follow-up visit. Patient educated  on when it is appropriate to go to the emergency department.   Mary-Margaret Gladis, FNP   "

## 2024-04-29 NOTE — Patient Instructions (Signed)
Chronic Bronchitis, Adult  Chronic bronchitis is inflammation inside of the main airways (bronchi) that come off the windpipe (trachea) in the lungs. The swelling causes the airways to narrow and make more mucus than normal. This can make it hard to breathe and may cause coughing or noisy breathing (wheezing). This condition is a type of chronic obstructive pulmonary disease (COPD). Chronic bronchitis is often associated with other chronic respiratory conditions, such as emphysema, asthma, bronchiectasis, or cystic fibrosis. Chronic bronchitis is a long-term (chronic) condition. It is defined as a chronic cough with mucus (sputum) production: For at least 3 months of the year. For 2 years in a row. People with chronic bronchitis are more likely to get colds and other infections in the nose, throat, or airways. What are the causes? This condition is most often caused by: A history of smoking. Exposure to secondhand smoke or a smoky area for a long period of time. Frequent lung infections. Long-term exposure to certain fumes or chemicals that irritate the lungs. What are the signs or symptoms? Symptoms of chronic bronchitis may include: A cough that brings up mucus (productive cough). A whistling sound when you breathe (wheezing). Shortness of breath. Chest tightness or soreness. Fever or chills. Colds or respiratory infections that go away and return. How is this diagnosed? Your health care provider may diagnose this condition based on your signs and symptoms, especially if you have a cough that lasts a long time or keeps coming back. This condition may be diagnosed based on: Your symptoms and medical history. A physical exam, including listening to your lungs. Tests, such as: Testing a sputum sample. Blood tests. A chest X-ray. Tests of lung (pulmonary) function. How is this treated? There is no cure for chronic bronchitis. Treatment may help control your symptoms. This  includes: Drinking fluids. This may help thin your mucus so it is easier to cough up. Mucus-clearing techniques. Your health care provider will show you which techniques are best for you. Medicines such as: Inhaled medicine (inhaler) to improve air flow in and out of your lungs. Antibiotics to treat or prevent bacterial lung infections. Mucus-thinning medicines. Pulmonary rehabilitation. This is a program that helps you learn how to manage your breathing problem. The program may include exercise, education, counseling, treatment, and support. Using oxygen therapy, if your blood oxygen level is very low. Follow these instructions at home: Medicines Take over-the-counter and prescription medicines only as told by your health care provider. If you were prescribed an antibiotic medicine, take it as told by your health care provider. Do not stop taking the antibiotic even if you start to feel better. Lifestyle  Do not use any products that contain nicotine or tobacco. These products include cigarettes, chewing tobacco, and vaping devices, such as e-cigarettes. If you need help quitting, ask your health care provider. Stay away from other people's smoke (secondhand smoke) and any irritants that make you cough more, such as chemical fumes. Eat a healthy diet and get regular exercise. Talk with your health care provider about what activities are safe for you. Return to normal activities as told by your health care provider. Ask your health care provider what activities are safe for you. Preventing infections Stay up to date on all immunizations, including the pneumonia and flu vaccines. Wash your hands often with soap and water for at least 20 seconds. If soap and water are not available, use hand sanitizer. Avoid contact with people who have symptoms of a cold or the flu. Keep  your environment free from any known allergens such as dust, mold, pets, and pollen. General instructions Get plenty of  rest. Drink enough fluids to keep your urine pale yellow. Use oxygen therapy at home as directed. Follow instructions from your health care provider about how to use oxygen safely and take steps to prevent fire. Do not smoke while using oxygen or allow others to smoke in your home. Keep all follow-up visits. This is important. Contact a health care provider if: Your shortness of breath or coughing gets worse even when you take medicine. Your mucus gets thicker or changes color. You are not able to cough up your mucus. You have a fever. Get help right away if: You cough up blood. You have trouble breathing. You have chest pain. You feel dizzy or confused. These symptoms may represent a serious problem that is an emergency. Do not wait to see if the symptoms will go away. Get medical help right away. Call your local emergency services (911 in the [place] Do not drive yourself to the hospital. Summary Chronic bronchitis is inflammation inside of the main airways (bronchi) that come off the windpipe (trachea) in the lungs. The swelling causes the airways to narrow and make more mucus than normal. Chronic bronchitis is a long-term (chronic) condition. It is defined as a chronic cough with mucus (sputum) production for at least 3 months of the year for 2 years in a row. If you were prescribed an antibiotic medicine, take it as told by your health care provider. Do not stop taking the antibiotic even if you start to feel better. Do not use any products that contain nicotine or tobacco. These products include cigarettes, chewing tobacco, and vaping devices, such as e-cigarettes. If you need help quitting, ask your health care provider. This information is not intended to replace advice given to you by your health care provider. Make sure you discuss any questions you have with your health care provider. Document Revised: 07/28/2020 Document Reviewed: 07/28/2020 Elsevier Patient Education  2024  [employer].

## 2024-04-30 ENCOUNTER — Ambulatory Visit: Payer: Self-pay | Admitting: Nurse Practitioner

## 2024-10-23 ENCOUNTER — Encounter: Payer: Self-pay | Admitting: Family
